# Patient Record
Sex: Female | Born: 1965 | Race: White | Hispanic: Yes | Smoking: Never smoker
Health system: Southern US, Community
[De-identification: ages and names within clinical notes are randomized; demographics above are authoritative.]

## PROBLEM LIST (undated history)

## (undated) DIAGNOSIS — J45909 Unspecified asthma, uncomplicated: Secondary | ICD-10-CM

## (undated) DIAGNOSIS — E8881 Metabolic syndrome: Secondary | ICD-10-CM

## (undated) DIAGNOSIS — E88819 Insulin resistance, unspecified: Secondary | ICD-10-CM

## (undated) DIAGNOSIS — M1990 Unspecified osteoarthritis, unspecified site: Secondary | ICD-10-CM

## (undated) DIAGNOSIS — G4730 Sleep apnea, unspecified: Secondary | ICD-10-CM

## (undated) DIAGNOSIS — E559 Vitamin D deficiency, unspecified: Secondary | ICD-10-CM

## (undated) DIAGNOSIS — G8929 Other chronic pain: Secondary | ICD-10-CM

## (undated) DIAGNOSIS — I10 Essential (primary) hypertension: Secondary | ICD-10-CM

## (undated) HISTORY — DX: Metabolic syndrome: E88.81

## (undated) HISTORY — DX: Unspecified asthma, uncomplicated: J45.909

## (undated) HISTORY — PX: CHOLECYSTECTOMY: SHX55

## (undated) HISTORY — DX: Insulin resistance, unspecified: E88.819

## (undated) HISTORY — DX: Sleep apnea, unspecified: G47.30

## (undated) HISTORY — DX: Essential (primary) hypertension: I10

## (undated) HISTORY — DX: Other chronic pain: G89.29

## (undated) HISTORY — DX: Unspecified osteoarthritis, unspecified site: M19.90

## (undated) HISTORY — DX: Vitamin D deficiency, unspecified: E55.9

## (undated) HISTORY — PX: GALLBLADDER SURGERY: SHX652

---

## 2014-03-04 ENCOUNTER — Encounter (HOSPITAL_COMMUNITY): Payer: Self-pay | Admitting: Emergency Medicine

## 2014-03-04 ENCOUNTER — Emergency Department (HOSPITAL_COMMUNITY)
Admission: EM | Admit: 2014-03-04 | Discharge: 2014-03-04 | Disposition: A | Discharge status: Home / Self Care | Payer: Self-pay | Attending: Emergency Medicine | Admitting: Emergency Medicine

## 2014-03-04 DIAGNOSIS — I10 Essential (primary) hypertension: Secondary | ICD-10-CM | POA: Insufficient documentation

## 2014-03-04 DIAGNOSIS — R209 Unspecified disturbances of skin sensation: Secondary | ICD-10-CM | POA: Insufficient documentation

## 2014-03-04 DIAGNOSIS — G5600 Carpal tunnel syndrome, unspecified upper limb: Secondary | ICD-10-CM | POA: Insufficient documentation

## 2014-03-04 DIAGNOSIS — R202 Paresthesia of skin: Secondary | ICD-10-CM

## 2014-03-04 LAB — COMPREHENSIVE METABOLIC PANEL
ALT: 40 U/L — AB (ref 0–35)
ANION GAP: 11 (ref 5–15)
AST: 32 U/L (ref 0–37)
Albumin: 4 g/dL (ref 3.5–5.2)
Alkaline Phosphatase: 72 U/L (ref 39–117)
BILIRUBIN TOTAL: 0.3 mg/dL (ref 0.3–1.2)
BUN: 11 mg/dL (ref 6–23)
CHLORIDE: 105 meq/L (ref 96–112)
CO2: 26 meq/L (ref 19–32)
Calcium: 9.3 mg/dL (ref 8.4–10.5)
Creatinine, Ser: 0.63 mg/dL (ref 0.50–1.10)
GFR calc Af Amer: >90 mL/min (ref >90)
GFR calc non Af Amer: >90 mL/min (ref >90)
GLUCOSE: 108 mg/dL — AB (ref 70–99)
POTASSIUM: 3.9 meq/L (ref 3.7–5.3)
Sodium: 142 mEq/L (ref 137–147)
Total Protein: 7.4 g/dL (ref 6.0–8.3)

## 2014-03-04 LAB — CBC WITH DIFFERENTIAL/PLATELET
Basophils Absolute: 0 10*3/uL (ref 0.0–0.1)
Basophils Relative: 0 % (ref 0–1)
Eosinophils Absolute: 0.5 10*3/uL (ref 0.0–0.7)
Eosinophils Relative: 7 % — ABNORMAL HIGH (ref 0–5)
HCT: 39.5 % (ref 36.0–46.0)
HEMOGLOBIN: 13.1 g/dL (ref 12.0–15.0)
LYMPHS PCT: 37 % (ref 12–46)
Lymphs Abs: 2.7 10*3/uL (ref 0.7–4.0)
MCH: 27.9 pg (ref 26.0–34.0)
MCHC: 33.2 g/dL (ref 30.0–36.0)
MCV: 84 fL (ref 78.0–100.0)
MONOS PCT: 6 % (ref 3–12)
Monocytes Absolute: 0.4 10*3/uL (ref 0.1–1.0)
NEUTROS ABS: 3.7 10*3/uL (ref 1.7–7.7)
NEUTROS PCT: 50 % (ref 43–77)
Platelets: 208 10*3/uL (ref 150–400)
RBC: 4.7 MIL/uL (ref 3.87–5.11)
RDW: 13.8 % (ref 11.5–15.5)
WBC: 7.2 10*3/uL (ref 4.0–10.5)

## 2014-03-04 MED ORDER — METOPROLOL TARTRATE 50 MG PO TABS: 100.0000 mg | ORAL_TABLET | Freq: Two times a day (BID) | ORAL | Status: DC

## 2014-03-04 MED ORDER — IBUPROFEN 600 MG PO TABS: 600.0000 mg | ORAL_TABLET | Freq: Four times a day (QID) | ORAL | Status: DC | PRN

## 2014-03-04 NOTE — ED Notes (Signed)
Pt reports she has been without insurance and access to healthcare as well as prescription medications.  Is concerned that her BP is high due to "feel it in my ears and my head."  When asked what her goal for today's visit she stated "check my blood pressure, that's all."

## 2014-03-04 NOTE — Discharge Instructions (Signed)
Hipertensin (Hypertension) La hipertensin, conocida comnmente como presin arterial alta, se produce cuando la sangre bombea en las arterias con mucha fuerza. Las arterias son los vasos sanguneos que transportan la sangre desde el corazn hacia todas las partes del cuerpo. Una lectura de la presin arterial consiste en un nmero ms alto sobre un nmero ms bajo, por ejemplo, 110/72. El nmero ms alto (presin sistlica) corresponde a la presin interna de las arterias cuando el corazn [place]. El nmero ms bajo (presin diastlica) corresponde a la presin interna de las arterias cuando el corazn se relaja. En condiciones ideales, la presin arterial debe ser inferior a 120/80. La hipertensin fuerza al corazn a trabajar ms para Herbalist. Las arterias pueden estrecharse o ponerse rgidas. La hipertensin conlleva el riesgo de enfermedad cardaca, ictus y otros problemas.  Solis de riesgo de hipertensin son controlables, pero otros no lo son.  [employer] factores de riesgo que usted no puede Chief Technology Officer, se incluyen:   Manufacturing systems engineer. El riesgo es mayor para las Retail banker.  La edad. Los riesgos aumentan con la edad.  El sexo. Antes de los 45aos, los hombres corren ms [employer]. Despus de los 65aos, las mujeres corren ms [employer]. Entre los factores de riesgo que usted puede Chief Technology Officer, se incluyen:  No hacer la cantidad suficiente de actividad fsica o ejercicio.  Tener sobrepeso.  Consumir mucha grasa, azcar, caloras o sal en la dieta.  Beber alcohol en exceso. SIGNOS Y SNTOMAS Por lo general, la hipertensin no causa signos o sntomas. La hipertensin demasiado alta (crisis hipertensiva) puede causar dolor de cabeza, ansiedad, falta de aire y hemorragia nasal. DIAGNSTICO  Para detectar si usted tiene hipertensin, el mdico le medir la presin arterial mientras est sentado, con el brazo  levantado a la altura del corazn. Debe medirla al [facility] veces en el mismo brazo. Determinadas condiciones pueden causar una diferencia de presin arterial entre el brazo izquierdo y Insurance underwriter. El hecho de tener una sola lectura de la presin arterial ms alta que lo normal no significa que Stage manager. En el caso de tener una lectura de la presin arterial con un valor alto, pdale al mdico que la verifique nuevamente. [employer] hipertensin arterial incluye hacer cambios en el estilo de vida y, posiblemente, tomar medicamentos. Un estilo de vida saludable puede ayudar a bajar la presin arterial alta. Quiz deba cambiar algunos hbitos. Los Levi Strauss en el estilo de vida pueden incluir:  Seguir la dieta DASH. Esta dieta tiene un alto contenido de frutas, verduras y Psychologist, prison and probation services. Incluye poca cantidad de sal, carnes rojas y azcares agregados.  Hacer al menos 2horas de actividad fsica enrgica todas las semanas.  Perder peso, si es necesario.  No fumar.  Limitar el consumo de bebidas alcohlicas.  Aprender formas de reducir el estrs. Si los cambios en el estilo de vida no son suficientes para Child psychotherapist la presin arterial, el mdico puede recetarle medicamentos. Quiz necesite tomar ms de uno. Trabaje en conjunto con su mdico para comprender los riesgos y los beneficios. INSTRUCCIONES PARA EL CUIDADO EN EL HOGAR  Haga que le midan de nuevo la presin arterial segn las indicaciones del [place] los medicamentos solamente como se lo haya indicado el mdico. Siga cuidadosamente las indicaciones. Los medicamentos para la presin arterial deben tomarse segn las indicaciones. Los medicamentos pierden eficacia al omitir las dosis. El hecho de omitir  las dosis tambin [address] de otros [place].  No fume.  Contrlese la presin arterial en su casa segn las indicaciones del mdico. SOLICITE ATENCIN MDICA SI:   Piensa  que tiene una reaccin alrgica a los medicamentos.  Tiene mareos o dolores de cabeza con Naval architect.  Tiene hinchazn en los tobillos.  Tiene problemas de visin. SOLICITE ATENCIN MDICA DE INMEDIATO SI:  Siente un dolor de cabeza intenso o confusin.  Siente debilidad inusual, adormecimiento o que Hospital doctor.  Siente dolor intenso en el pecho o en el abdomen.  Vomita repetidas veces.  Tiene dificultad para respirar. ASEGRESE DE QUE:   Comprende estas instrucciones.  Controlar su afeccin.  Recibir ayuda de inmediato si no mejora o si empeora. Document Released: 07/18/2005 Document Revised: 12/02/2013 [facility] Patient Information 2015 [place], [place]. This information is not intended to replace advice given to you by your health care provider. Make sure you discuss any questions you have with your health care provider. Liberacin del tnel carpiano (Carpal Tunnel Release) Hoy le han realizado la liberacin (reparacin) del tnel carpiano. Este procedimiento se realiz para Technical sales engineer presin [employer] nervios y tendones de la parte inferior de la [place].  INFORME AL PROFESIONAL QUE LO ASISTE SOBRE LOS SIGUIENTES PUNTOS:  Alergias.  Medicamentos que toma, incluyendo hierbas, gotas oftlmicas, medicamentos de venta libre y cremas.  Uso de esteroides (por va oral o cremas).  Problemas anteriores debido a anestsicos o a Patent examiner.  Antecedentes de problemas sanguneos o cogulos sanguneos (tromboflebitis).  Cirugas previas.  Otros problemas de [place].  Posibilidad de embarazo, si correspondiera RIESGOS Y COMPLICACIONES Algunos problemas que pueden ocurrir luego del procedimiento son:  Infeccin.  Pueden ocurrir [employer] nervios, las arterias o tendones. Esto es poco frecuente.  El sangrado. ANTES DEL PROCEDIMIENTO  Esta ciruga se practica bajo anestesia general, mientras se encuentra dormido, o puede llevarse a cabo bajo un bloqueo regional  en el que slo el antebrazo y el rea de la ciruga estn dormidos.  Si la ciruga se efecta bajo un bloqueo regional, el adormecimiento desaparecer gradualmente en algunas horas luego de la ciruga. INSTRUCCIONES PARA EL CUIDADO DOMICILIARIO  Haga que una persona responsable lo acompae durante 24 horas.  No conduzca un automvil, ni una bicicleta; no participe en actividades en las que pueda lastimarse, no tome transportes pblicos hasta que suspenda los analgsicos opiceos para Engineer, materials y Engineer, manufacturing el consentimiento del profesional que lo asiste.  Utilice los medicamentos de venta libre o de prescripcin para Chief Technology Officer, Environmental health practitioner o la [place], segn se lo indique el profesional que lo asiste.  Deber aplicar hielo en la palma de la mano de la [place] afectada.  Ponga el hielo en una bolsa plstica.  Colquese una toalla entre la piel y Copy.  Aplique el hielo durante 20 a 30 minutos, 4 veces por da.  Si le Patent attorney una tablilla para evitar que la [place] se doble, sela como le indicaron. Es importante que use la tabilla durante la noche, o segn le indicaron. Utilice la tablilla mientras sienta dolor o adormecimiento en la mano, el brazo o la [place]. Esto puede durar entre 1 y 2 meses.  Mantenga la mano elevada por encima del nivel del corazn (el centro del pecho) todo lo posible. Esto evitar la hinchazn y las [place].  Cambie el vendaje tal como se le indic.  Mantenga la herida limpia y seca. SOLICITE ATENCIN MDICA SI:  El dolor no  se alivia con los medicamentos.  Presenta dolor o adormecimiento en la mano.  Aparece una hemorragia en el lugar de la ciruga.  Desarrolla una temperatura superior a 38,9 C (102 F).  Presenta enrojecimiento o hinchazn en la zona de la operacin.  Desarrolla nuevos sntomas. SOLICITE ATENCIN MDICA DE INMEDIATO SI:  Aparece una erupcin cutnea.  Presenta dificultades respiratorias.  Siente dificultad  para respirar o parece tener algn tipo de reaccin o efectos secundarios a los medicamentos. Document Released: 07/18/2005 Document Revised: 10/10/2011 [facility] Patient Information 2015 [place], [place]. This information is not intended to replace advice given to you by your health care provider. Make sure you discuss any questions you have with your health care provider.   Emergency Department Resource Guide 1) Find a Doctor and Pay Out of Pocket Although you won't have to find out who is covered by your insurance plan, it is a good idea to ask around and get recommendations. You will then need to call the office and see if the doctor you have chosen will accept you as a new patient and what types of options they offer for patients who are self-pay. Some doctors offer discounts or will set up payment plans for their patients who do not have insurance, but you will need to ask so you aren't surprised when you get to your appointment.  2) Contact Your Local Health Department Not all health departments have doctors that can see patients for sick visits, but many do, so it is worth a call to see if yours does. If you don't know where your local health department is, you can check in your phone book. The CDC also has a tool to help you locate your state's health department, and many state websites also have listings of all of their local health departments.  3) Find a Walk-in Clinic If your illness is not likely to be very severe or complicated, you may want to try a walk in clinic. These are popping up all over the country in pharmacies, drugstores, and shopping centers. They're usually staffed by nurse practitioners or physician assistants that have been trained to treat common illnesses and complaints. They're usually fairly quick and inexpensive. However, if you have serious medical issues or chronic medical problems, these are probably not your best option.  No Primary Care Doctor: - Call Health  Connect at  [phone] - they can help you locate a primary care doctor that  accepts your insurance, provides certain services, etc. - Physician Referral Service- [phone]  Chronic Pain Problems: Organization         Address  Phone   Notes  [facility]  [phone] Patients need to be referred by their primary care doctor.   Medication Assistance: Organization         Address  Phone   Notes  [facility] [address] [place], [place] [phone] --Must be a resident of [place] -- Must have NO insurance coverage whatsoever (no Medicaid/ Medicare, etc.) -- The pt. MUST have a primary care doctor that directs their care regularly and follows them in the community   MedAssist  [phone]   [employer]  [phone]    Agencies that provide inexpensive medical care: Organization         Address  Phone   Notes  [facility]  [phone]   [facility]    [phone]  [facility] [address] [place], [place] [address] [phone]   [facility] [address] [place] [place] [phone]   Planned Parenthood    [phone]   [facility]    [phone]   Community Health and [facility]  [address], [place] Phone:  [phone], Fax:  [phone] Hours of Operation:  9 am - 6 pm, M-F.  Also accepts Medicaid/Medicare and self-pay.  [facility]  [address], [place] Phone: [phone], Fax: [phone]. Hours of Operation:  8:30 am - 5:30 pm, M-F.  Also accepts Medicaid and self-pay.  [facility] [place] [address] [place] Phone: [phone]   [facility] [address] Natasha Bence [place], [place] [phone], Ext. [phone] Mondays & Thursdays: 7-9 AM.  First 15 patients are seen on a first come, first serve basis.     Medicaid-accepting [facility] Providers:  Organization         Address  Phone   Notes  [facility] [address], [place] [phone] Also accepts self-pay patients.  [facility] [address] Laurell Josephs [place], [place]  [phone]   [facility] [address], [place] [phone]   [facility] [address] [place] [phone]   Renaye Rakers [address], [place]   [phone] Only accepts [place] Access [place] patients after they have their name applied to their card.   Self-Pay (no insurance) in [facility]:  Organization         Address  Phone   Notes  Sickle Cell Patients, [facility] [address] [place], [place] [phone]   [facility] Urgent Care [address] [place], [place] [phone]   [facility]  [address], [place] [phone]   [facility]/Dr. Osei-Bonsu  [address] [place] or [address], [place] [phone] Phone number for both [place] and [place] locations is the same.  Urgent Medical and [facility] [address] [place] [phone]   [facility] [address], [place] or [address] [phone]   [facility] [address], [place] [phone], phone; [phone], fax Sees patients 1st and 3rd Saturday of every month.  Must not qualify for public or private insurance (i.e. Medicaid, Medicare, [place] Health Choice, Veterans' Benefits)  Household income should be no more than 200% of the poverty level The clinic cannot treat you if you are pregnant or think you are pregnant  Sexually transmitted diseases are not treated at the clinic.    Dental Care: Organization         Address  Phone  Notes  [facility]  Department of [facility] [address] [place], [place] [phone] Accepts children up to age 14 who are enrolled in [place] or [place] Health Choice; pregnant women with a Medicaid card; and children who have applied for Medicaid or [place] Health Choice, but were declined, whose parents can pay a reduced fee at time of service.  [facility] of [facility]  [address], [place] [phone] Accepts children up to age 37 who  are enrolled in Medicaid or [place] Health Choice; pregnant women with a Medicaid card; and children who have applied for Medicaid or [place] Health Choice, but were declined, whose parents can pay a reduced fee at time of service.  [facility]  [address] [place], [place] [phone] Patients are seen by appointment only. Walk-ins are not accepted. [facility] will see patients 41 years of age and older. Monday - Tuesday (8am-5pm) Most Wednesdays (8:30-5pm) $30 per visit, cash only  [facility] Adult Dental Access PROGRAM  [address], [facility] [phone] Patients are seen by appointment only. Walk-ins are not accepted. [facility] will see patients 52 years of age and older. One Wednesday Evening (Monthly: Volunteer Based).  $30 per visit, cash only  [employer]  [phone] for adults; Children under age 91, call Graduate Pediatric Dentistry at [phone]. Children aged 44-14, please call [phone] to request a pediatric application.  Dental services are provided in all areas of dental care including fillings, crowns and bridges, complete and partial dentures, implants, gum treatment, root canals, and extractions. Preventive care is also provided. Treatment is provided to both adults and children. Patients are selected via a lottery and there is often a waiting list.   [facility] [address], [place]  [phone]  [email or web address]   [facility] [address] [place], [place] [phone], Ext. [phone] Second and Fourth Thursday of each month, opens at 6:30 AM; Clinic ends at 9 AM.  Patients are seen on a first-come first-served basis, and a limited number are seen during each clinic.   [facility]  [address] Ether Griffins [place], [place] [phone]   Eligibility Requirements You must have lived in [place], [place], or [place] counties for at least the last three months.   You cannot be eligible for state or federal sponsored [place], including [employer], [place], or [employer].   You generally cannot be eligible for healthcare insurance through your employer.    How to apply: Eligibility screenings are held every Tuesday and Wednesday afternoon from 1:00 pm until 4:00 pm. You do not need an appointment for the interview!  [facility] [address] [place], [place] [phone]   [facility] Health Department  [phone]   [facility] Health Department  [phone]   [facility] Health Department  [phone]    Behavioral Health Resources in the Community: Intensive Outpatient Programs Organization         Address  Phone  Notes  [facility] [address] [place], [place] [phone]   [facility] Outpatient [address] [place], [place] [phone]   [facility]: Alcohol & Drug Svcs [address], [place], [place]  [phone]   [facility] Mental Health [address]  [place], [place] [phone] or [phone]   Substance Abuse Resources Organization         Address  Phone  Notes  Alcohol [facility] Drug Services  [phone]   [facility]  [phone]   [facility] [place]  [phone]   Floydene Flock  [phone]   Residential & Outpatient Substance Abuse Program  [phone]   Psychological Services Organization          Address  Phone  Notes  [facility] Behavioral Health  [phone]   [facility] Services  [phone]   [facility] [address],  [place] [phone] or [phone]    Mobile Crisis Teams Organization         Address  Phone  Notes  [facility], Mobile Crisis Care Unit  [phone]   [facility]  [address] [place], [place] [phone]   Doristine Locks [address] [place] [phone]    Self-Help/Support Groups Organization         Address  Phone             Notes  [facility] [place] - variety of support groups  [phone]- I7437963 Call for more information  Narcotics Anonymous (NA), [facility] [address], [employer] [place]  2 meetings at this location   Statistician         Address  Phone  Notes  [facility] [address],    [place]  [phone]   [facility]  [address] [place] [address], [place], [place] [phone]   [facility] [address] [place], [place] [phone] Admissions: 8am-3pm M-F  [facility] [address],    [place], [place] [phone]   [facility] [address] [place], [place], [place] [phone]   [facility] [address],  [place], [place] [phone]   [facility] - Intensive Outpatient [address] Laurell Josephs [address], [place], [place] [phone]   [facility] ([facility] [address]  [place], [place] [phone] or [phone]   [facility] ([facility]) [address], [place], [place] [phone] Accepts Medicaid  [facility] [address]  [place] [phone] Substance Abuse/Addiction Treatment   [facility] Organization         Address  Phone  Notes  [facility]  [phone]   Angie Fava, PhD [address] Ervin Knack [place], [place]   [phone] or [phone]   [facility] Behavioral   [address] [place], [place] [phone]   [facility] [address] [place], [place] [phone] Insurance/Medicaid/sponsorship through [facility] and Families [address]                                    [place], [place] [phone] Therapy/tele-psych/case  [facility] [address], [place] [phone]    Dr. Lolly Mustache  [phone]   [facility]  United Way [facility] Dept. 1) [address], [place] 2) [address], [place] 3)  [address], [place] [phone]  [phone]   [facility] Child Abuse Hotline 6472325438 or [phone] (After Hours)

## 2014-03-04 NOTE — ED Provider Notes (Signed)
CSN: [id number]     Arrival date & time 03/04/14  0033 History   First MD Initiated Contact with Patient 03/04/14 0304     Chief Complaint  Patient presents with  . Hypertension     (Consider location/radiation/quality/duration/timing/severity/associated sxs/prior Treatment) HPI The patient has a history of hypertension but has not been on her blood pressure medication for nearly a year. She states that she lost her insurance and was unable to follow with her primary Dr. Over the past few days she's been taking her blood pressure has been persistently elevated in the 150s/100s. This is associated with a frontal throbbing headache and pounding in ears. She had no focal weakness or numbness. Currently her headache is improved. Her blood pressure is 140s/90s.  Patient also complains of episodic bilateral hand paresthesias. She states it's worse at night. She currently has no symptoms. Denies any weakness associated with this. History reviewed. No pertinent past medical history. History reviewed. No pertinent past surgical history. No family history on file. History  Substance Use Topics  . Smoking status: Never Smoker   . Smokeless tobacco: Not on file  . Alcohol Use: No   OB History   Grav Para Term Preterm Abortions TAB SAB Ect Mult Living                 Review of Systems  Constitutional: Negative for fever and chills.  Eyes: Negative for photophobia and visual disturbance.  Respiratory: Negative for cough, chest tightness and shortness of breath.   Cardiovascular: Negative for chest pain, palpitations and leg swelling.  Gastrointestinal: Negative for nausea, vomiting, abdominal pain, diarrhea and constipation.  Genitourinary: Negative for dysuria, frequency and flank pain.  Musculoskeletal: Negative for back pain, myalgias, neck pain and neck stiffness.  Skin: Negative for rash and wound.  Neurological: Positive for headaches. Negative for dizziness, tremors, syncope, weakness,  light-headedness and numbness.  All other systems reviewed and are negative.     Allergies  Review of patient's allergies indicates no known allergies.  Home Medications   Prior to Admission medications   Not on File   BP 143/82  Pulse 83  Temp(Src) 98.1 F (36.7 C) (Oral)  Resp 16  Ht 5\' 4"  (1.626 m)  Wt 222 lb (100.699 kg)  BMI 38.09 kg/m2  SpO2 97% Physical Exam  Nursing note and vitals reviewed. Constitutional: She is oriented to person, place, and time. She appears well-developed and well-nourished. No distress.  HENT:  Head: Normocephalic and atraumatic.  Mouth/Throat: Oropharynx is clear and moist.  No sinus tenderness to percussion.  Eyes: EOM are normal. Pupils are equal, round, and reactive to light.  Neck: Normal range of motion. Neck supple.  No meningismus  Cardiovascular: Normal rate and regular rhythm.   Pulmonary/Chest: Effort normal and breath sounds normal. No respiratory distress. She has no wheezes. She has no rales. She exhibits no tenderness.  Abdominal: Soft. Bowel sounds are normal. She exhibits no distension and no mass. There is no tenderness. There is no rebound and no guarding.  Musculoskeletal: Normal range of motion. She exhibits no edema and no tenderness.  No calf swelling or tenderness. Negative Tinel's and Phalen's sign. Good distal cap refill.  Neurological: She is alert and oriented to person, place, and time.  5/5 motor in all extremities. Sensation is intact. Finger to nose intact. Patient ambulatory without difficulty.  Skin: Skin is warm and dry. No rash noted. No erythema.  Psychiatric: She has a normal mood and affect. Her behavior  is normal.    ED Course  Procedures (including critical care time) Labs Review Labs Reviewed  CBC WITH DIFFERENTIAL - Abnormal; Notable for the following:    Eosinophils Relative 7 (*)    All other components within normal limits  COMPREHENSIVE METABOLIC PANEL - Abnormal; Notable for the  following:    Glucose, Bld 108 (*)    ALT 40 (*)    All other components within normal limits    Imaging Review No results found.   EKG Interpretation None      MDM   Final diagnoses:  None    We'll refill patient's blood pressure medication and give resource list to establish primary care Dr. Bernestine Amass the patient's paresthesias are due to carpal tunnel syndrome. We'll place in bilateral wrist splints to start on anti-inflammatories. She will also be referred to a hand surgeon. She's been given return precautions and is voiced understanding. She is in normal neurologic exam this time. I do not think imaging is necessary.    Loren Racer, MD 03/04/14 [phone]

## 2014-03-04 NOTE — ED Notes (Signed)
Pt. reports elevated blood pressure ( 157/96 ) for the past several days with headache / head pressure /nausea.

## 2014-12-12 ENCOUNTER — Emergency Department (HOSPITAL_COMMUNITY)
Admission: EM | Admit: 2014-12-12 | Discharge: 2014-12-12 | Disposition: A | Discharge status: Home / Self Care | Payer: BLUE CROSS/BLUE SHIELD | Attending: Emergency Medicine | Admitting: Emergency Medicine

## 2014-12-12 ENCOUNTER — Emergency Department (HOSPITAL_COMMUNITY): Payer: BLUE CROSS/BLUE SHIELD

## 2014-12-12 ENCOUNTER — Encounter (HOSPITAL_COMMUNITY): Payer: Self-pay | Admitting: *Deleted

## 2014-12-12 DIAGNOSIS — M25562 Pain in left knee: Secondary | ICD-10-CM | POA: Diagnosis not present

## 2014-12-12 DIAGNOSIS — R51 Headache: Secondary | ICD-10-CM | POA: Diagnosis not present

## 2014-12-12 DIAGNOSIS — Z79899 Other long term (current) drug therapy: Secondary | ICD-10-CM | POA: Insufficient documentation

## 2014-12-12 LAB — CBG MONITORING, ED: Glucose-Capillary: 97 mg/dL (ref 65–99)

## 2014-12-12 MED ORDER — MELOXICAM 7.5 MG PO TABS: 7.5000 mg | ORAL_TABLET | Freq: Every day | ORAL | Status: DC

## 2014-12-12 NOTE — Discharge Instructions (Signed)
Read the information below.  Use the prescribed medication as directed.  Please discuss all new medications with your pharmacist.  You may return to the Emergency Department at any time for worsening condition or any new symptoms that concern you.   If you develop uncontrolled pain, weakness or numbness of the extremity, severe discoloration of the skin, or you are unable to walk or move your knee, return to the ER for a recheck.      Knee Pain The knee is the complex joint between your thigh and your lower leg. It is made up of bones, tendons, ligaments, and cartilage. The bones that make up the knee are:  The femur in the thigh.  The tibia and fibula in the lower leg.  The patella or kneecap riding in the groove on the lower femur. CAUSES  Knee pain is a common complaint with many causes. A few of these causes are:  Injury, such as:  A ruptured ligament or tendon injury.  Torn cartilage.  Medical conditions, such as:  Gout  Arthritis  Infections  Overuse, over training, or overdoing a physical activity. Knee pain can be minor or severe. Knee pain can accompany debilitating injury. Minor knee problems often respond well to self-care measures or get well on their own. More serious injuries may need medical intervention or even surgery. SYMPTOMS The knee is complex. Symptoms of knee problems can vary widely. Some of the problems are:  Pain with movement and weight bearing.  Swelling and tenderness.  Buckling of the knee.  Inability to straighten or extend your knee.  Your knee locks and you cannot straighten it.  Warmth and redness with pain and fever.  Deformity or dislocation of the kneecap. DIAGNOSIS  Determining what is wrong may be very straight forward such as when there is an injury. It can also be challenging because of the complexity of the knee. Tests to make a diagnosis may include:  Your caregiver taking a history and doing a physical exam.  Routine  X-rays can be used to rule out other problems. X-rays will not reveal a cartilage tear. Some injuries of the knee can be diagnosed by:  Arthroscopy a surgical technique by which a small video camera is inserted through tiny incisions on the sides of the knee. This procedure is used to examine and repair internal knee joint problems. Tiny instruments can be used during arthroscopy to repair the torn knee cartilage (meniscus).  Arthrography is a radiology technique. A contrast liquid is directly injected into the knee joint. Internal structures of the knee joint then become visible on X-ray film.  An MRI scan is a non X-ray radiology procedure in which magnetic fields and a computer produce two- or three-dimensional images of the inside of the knee. Cartilage tears are often visible using an MRI scanner. MRI scans have largely replaced arthrography in diagnosing cartilage tears of the knee.  Blood work.  Examination of the fluid that helps to lubricate the knee joint (synovial fluid). This is done by taking a sample out using a needle and a syringe. TREATMENT The treatment of knee problems depends on the cause. Some of these treatments are:  Depending on the injury, proper casting, splinting, surgery, or physical therapy care will be needed.  Give yourself adequate recovery time. Do not overuse your joints. If you begin to get sore during workout routines, back off. Slow down or do fewer repetitions.  For repetitive activities such as cycling or running, maintain your strength  and nutrition.  Alternate muscle groups. For example, if you are a weight lifter, work the upper body on one day and the lower body the next.  Either tight or weak muscles do not give the proper support for your knee. Tight or weak muscles do not absorb the stress placed on the knee joint. Keep the muscles surrounding the knee strong.  Take care of mechanical problems.  If you have flat feet, orthotics or special shoes  may help. See your caregiver if you need help.  Arch supports, sometimes with wedges on the inner or outer aspect of the heel, can help. These can shift pressure away from the side of the knee most bothered by osteoarthritis.  A brace called an "unloader" brace also may be used to help ease the pressure on the most arthritic side of the knee.  If your caregiver has prescribed crutches, braces, wraps or ice, use as directed. The acronym for this is PRICE. This means protection, rest, ice, compression, and elevation.  Nonsteroidal anti-inflammatory drugs (NSAIDs), can help relieve pain. But if taken immediately after an injury, they may actually increase swelling. Take NSAIDs with food in your stomach. Stop them if you develop stomach problems. Do not take these if you have a history of ulcers, stomach pain, or bleeding from the bowel. Do not take without your caregiver's approval if you have problems with fluid retention, heart failure, or kidney problems.  For ongoing knee problems, physical therapy may be helpful.  Glucosamine and chondroitin are over-the-counter dietary supplements. Both may help relieve the pain of osteoarthritis in the knee. These medicines are different from the usual anti-inflammatory drugs. Glucosamine may decrease the rate of cartilage destruction.  Injections of a corticosteroid drug into your knee joint may help reduce the symptoms of an arthritis flare-up. They may provide pain relief that lasts a few months. You may have to wait a few months between injections. The injections do have a small increased risk of infection, water retention, and elevated blood sugar levels.  Hyaluronic acid injected into damaged joints may ease pain and provide lubrication. These injections may work by reducing inflammation. A series of shots may give relief for as long as 6 months.  Topical painkillers. Applying certain ointments to your skin may help relieve the pain and stiffness of  osteoarthritis. Ask your pharmacist for suggestions. Many over the-counter products are approved for temporary relief of arthritis pain.  In some countries, doctors often prescribe topical NSAIDs for relief of chronic conditions such as arthritis and tendinitis. A review of treatment with NSAID creams found that they worked as well as oral medications but without the serious side effects. PREVENTION  Maintain a healthy weight. Extra pounds put more strain on your joints.  Get strong, stay limber. Weak muscles are a common cause of knee injuries. Stretching is important. Include flexibility exercises in your workouts.  Be smart about exercise. If you have osteoarthritis, chronic knee pain or recurring injuries, you may need to change the way you exercise. This does not mean you have to stop being active. If your knees ache after jogging or playing basketball, consider switching to swimming, water aerobics, or other low-impact activities, at least for a few days a week. Sometimes limiting high-impact activities will provide relief.  Make sure your shoes fit well. Choose footwear that is right for your sport.  Protect your knees. Use the proper gear for knee-sensitive activities. Use kneepads when playing volleyball or laying carpet. Buckle your seat belt  every time you drive. Most shattered kneecaps occur in car accidents.  Rest when you are tired. SEEK MEDICAL CARE IF:  You have knee pain that is continual and does not seem to be getting better.  SEEK IMMEDIATE MEDICAL CARE IF:  Your knee joint feels hot to the touch and you have a high fever. MAKE SURE YOU:   Understand these instructions.  Will watch your condition.  Will get help right away if you are not doing well or get worse. Document Released: 05/15/2007 Document Revised: 10/10/2011 Document Reviewed: 05/15/2007 [facility] Patient Information 2015 [place], [place]. This information is not intended to replace advice given to you by  your health care provider. Make sure you discuss any questions you have with your health care provider.

## 2014-12-12 NOTE — ED Notes (Signed)
Patient refused crutches.

## 2014-12-12 NOTE — ED Notes (Signed)
Pt reports left knee pain x 3 days. Denies injury. Ambulatory at triage.

## 2014-12-12 NOTE — ED Provider Notes (Signed)
CSN: [id number]     Arrival date & time 12/12/14  1848 History  This chart was scribed for Trixie DredgeEmily Sherrika Weakland, PA-C, working with Cathren LaineKevin Steinl, MD by Chestine SporeSoijett Blue, ED Scribe. The patient was seen in room TR09C/TR09C at 7:20 PM.    Chief Complaint  Patient presents with  . Knee Pain     The history is provided by the patient. No language interpreter was used.    Fuller PlanMaria Bristow is a 49 y.o. female who presents to the Emergency Department complaining of left knee pain onset 2 days. Pt denies any recent injury or fall. When the pt was 49 years old she injured her knee skating and she has not had problems since then. Pt is now feeling a pain when she dorsiflexes and plantar flexes her foot. Pt thinks that she has fluid in the knee as well as the meniscus giving her trouble. She notes that she has tried naprosyn with no relief of her symptoms. She denies numbness, tingling, left calf pain, leg swelling, any other joint pain, fevers, chills, myalgias. Denies being sick recently or having an infection lately.    History reviewed. No pertinent past medical history. History reviewed. No pertinent past surgical history. History reviewed. No pertinent family history. History  Substance Use Topics  . Smoking status: Never Smoker   . Smokeless tobacco: Not on file  . Alcohol Use: No   OB History    No data available     Review of Systems  Constitutional: Negative for fever and chills.  HENT: Negative for congestion.   Cardiovascular: Negative for leg swelling.  Musculoskeletal: Positive for myalgias and arthralgias. Negative for joint swelling.  Skin: Negative for wound.  Allergic/Immunologic: Negative for immunocompromised state.  Neurological: Positive for headaches (mild frontal headache). Negative for weakness and numbness.  Hematological: Does not bruise/bleed easily.  Psychiatric/Behavioral: Negative for self-injury.      Allergies  Review of patient's allergies indicates no known  allergies.  Home Medications   Prior to Admission medications   Medication Sig Start Date End Date Taking? Authorizing Provider  ibuprofen (ADVIL,MOTRIN) 600 MG tablet Take 1 tablet (600 mg total) by mouth every 6 (six) hours as needed. 03/04/14   Loren Raceravid Yelverton, MD  metoprolol (LOPRESSOR) 50 MG tablet Take 2 tablets (100 mg total) by mouth 2 (two) times daily. 03/04/14   Loren Raceravid Yelverton, MD   BP 129/79 mmHg  Pulse 60  Temp(Src) 97.9 F (36.6 C) (Oral)  Resp 18  SpO2 98% Physical Exam  Constitutional: She appears well-developed and well-nourished. No distress.  HENT:  Head: Normocephalic and atraumatic.  Neck: Neck supple.  Cardiovascular: Intact distal pulses.   Pulmonary/Chest: Effort normal.  Musculoskeletal:       Left knee: She exhibits no swelling, no erythema, no LCL laxity and no MCL laxity. Tenderness (mild) found.       Left lower leg: Normal.  LEFT Lower Extremities:  Calf without tenderness. Sensation intact. Left knee: No laxity of the joint. No erythema, edema, or warmth. Mild diffuse tenderness to the anterior aspect of the knee. Able to flex left knee to 90 degrees with full extension. No discoloration. Distal pulses intact.     Neurological: She is alert. No sensory deficit.  Skin: She is not diaphoretic.  Nursing note and vitals reviewed.   ED Course  Procedures (including critical care time) DIAGNOSTIC STUDIES: Oxygen Saturation is 98% on RA, nl by my interpretation.    COORDINATION OF CARE: 7:24 PM-Discussed treatment plan which  includes Left knee X-ray with pt at bedside and pt agreed to plan.   Labs Review Labs Reviewed  CBG MONITORING, ED    Imaging Review Dg Knee Complete 4 Views Left  12/12/2014   CLINICAL DATA:  Acute onset of left knee pain for 3 days. Initial encounter.  EXAM: LEFT KNEE - COMPLETE 4+ VIEW  COMPARISON:  None.  FINDINGS: There is no evidence of fracture or dislocation. An osseous fragment overlying the lateral collateral ligament  complex may reflect remote traumatic injury. Small tibial spine and wall osteophytes are noted. There is mild cortical irregularity along the articular surface of the patella. The joint spaces are preserved. No significant degenerative change is seen; the patellofemoral joint is grossly unremarkable in appearance.  No significant joint effusion is seen. There is suggestion of medial soft tissue swelling at the level of the knee.  IMPRESSION: 1. No evidence of acute fracture or dislocation. 2. Suggestion of medial soft tissue swelling. 3. Osseous fragment overlying the lateral collateral ligament complex may reflect remote traumatic injury. 4. Minimal tibial spine and wall osteophytes noted. Mild cortical irregularity along the articular surface of the patella. Otherwise no significant degenerative change seen.   Electronically Signed   By: Roanna RaiderJeffery  Chang M.D.   On: 12/12/2014 20:01     EKG Interpretation None      MDM   Final diagnoses:  Left knee pain    Afebrile, nontoxic patient with left knee pain without injury x 2 days. Neurovascularly intact.  Xray unremarkable.   D/C home with knee sleeve, mobic, crutches (pt refused), orthopedic follow up.  Discussed result, findings, treatment, and follow up  with patient.  Pt given return precautions.  Pt verbalizes understanding and agrees with plan.        I personally performed the services described in this documentation, which was scribed in my presence. The recorded information has been reviewed and is accurate.    Trixie Dredgemily Azhar Yogi, PA-C 12/12/14 2114  Cathren LaineKevin Steinl, MD 12/12/14 2122

## 2015-02-20 ENCOUNTER — Encounter (HOSPITAL_COMMUNITY): Payer: Self-pay | Admitting: Emergency Medicine

## 2015-02-20 DIAGNOSIS — Z79899 Other long term (current) drug therapy: Secondary | ICD-10-CM | POA: Diagnosis not present

## 2015-02-20 DIAGNOSIS — Z791 Long term (current) use of non-steroidal anti-inflammatories (NSAID): Secondary | ICD-10-CM | POA: Diagnosis not present

## 2015-02-20 DIAGNOSIS — Y999 Unspecified external cause status: Secondary | ICD-10-CM | POA: Diagnosis not present

## 2015-02-20 DIAGNOSIS — S39012A Strain of muscle, fascia and tendon of lower back, initial encounter: Secondary | ICD-10-CM | POA: Diagnosis not present

## 2015-02-20 DIAGNOSIS — Y929 Unspecified place or not applicable: Secondary | ICD-10-CM | POA: Diagnosis not present

## 2015-02-20 DIAGNOSIS — Y939 Activity, unspecified: Secondary | ICD-10-CM | POA: Insufficient documentation

## 2015-02-20 DIAGNOSIS — X58XXXA Exposure to other specified factors, initial encounter: Secondary | ICD-10-CM | POA: Diagnosis not present

## 2015-02-20 DIAGNOSIS — M545 Low back pain: Secondary | ICD-10-CM | POA: Diagnosis present

## 2015-02-20 NOTE — ED Notes (Addendum)
Patient with two week history of back pain.  Patient denies any injury to the area but she states she has been standing more and more at work.  Patient has been taking diclofenac and ibuprofen, has helped but only for an hour or two.  Patient states that there is so much pain.  Patient states that she does not have any urinary symptoms.

## 2015-02-21 ENCOUNTER — Emergency Department (HOSPITAL_COMMUNITY)
Admission: EM | Admit: 2015-02-21 | Discharge: 2015-02-21 | Disposition: A | Discharge status: Home / Self Care | Payer: BLUE CROSS/BLUE SHIELD | Attending: Emergency Medicine | Admitting: Emergency Medicine

## 2015-02-21 DIAGNOSIS — S39012A Strain of muscle, fascia and tendon of lower back, initial encounter: Secondary | ICD-10-CM

## 2015-02-21 MED ORDER — KETOROLAC TROMETHAMINE 60 MG/2ML IM SOLN
60.0000 mg | Freq: Once | INTRAMUSCULAR | Status: AC
  Administered 2015-02-21: 60 mg via INTRAMUSCULAR
  Filled 2015-02-21: qty 2

## 2015-02-21 MED ORDER — CYCLOBENZAPRINE HCL 10 MG PO TABS
10.0000 mg | ORAL_TABLET | Freq: Once | ORAL | Status: AC
  Administered 2015-02-21: 10 mg via ORAL
  Filled 2015-02-21: qty 1

## 2015-02-21 MED ORDER — MELOXICAM 15 MG PO TABS: 15.0000 mg | ORAL_TABLET | Freq: Every day | ORAL | Status: DC

## 2015-02-21 MED ORDER — METHOCARBAMOL 500 MG PO TABS: 500.0000 mg | ORAL_TABLET | Freq: Two times a day (BID) | ORAL | Status: DC

## 2015-02-21 NOTE — Discharge Instructions (Signed)
Take Mobic as needed for pain. Take Robaxin as needed for muscle spasm. You may take these medications together. Refer to attached documents for more information.  °

## 2015-02-21 NOTE — ED Provider Notes (Signed)
CSN: [id number]     Arrival date & time 02/20/15  2320 History   First MD Initiated Contact with Patient 02/21/15 0214     Chief Complaint  Patient presents with  . Back Pain     (Consider location/radiation/quality/duration/timing/severity/associated sxs/prior Treatment) HPI Comments: Patricia Gross, 49 y/o female presents with back pain. The pain started two weeks ago and is worsening. The back pain is greater on the right side. It is her lower back around the sacroiliac area. It does not radiate or shoot down her leg. 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 She has tried naprosyn with relief lasting for two hours. She denies loss of bowel or bladder control, trauma, headaches, fevers, or leg pain.  Patient is a 49 y.o. female presenting with back pain. The history is provided by the patient.  Back Pain Location:  Sacro-iliac joint Radiates to:  Does not radiate Onset quality:  Gradual Duration:  2 weeks Progression:  Worsening Chronicity:  New Relieved by:  Ibuprofen Worsened by:  Bending Associated symptoms: no bladder incontinence, no bowel incontinence, no fever, no headaches and no leg pain   Risk factors: no hx of cancer     History reviewed. No pertinent past medical history. History reviewed. No pertinent past surgical history. No family history on file. History  Substance Use Topics  . Smoking status: Never Smoker   . Smokeless tobacco: Not on file  . Alcohol Use: No   OB History    No data available     Review of Systems  Constitutional: Negative for fever.  Gastrointestinal: Negative for bowel incontinence.  Genitourinary: Negative for bladder incontinence.  Musculoskeletal: Positive for back pain.  Neurological: Negative for headaches.  All other systems reviewed and are negative.     Allergies  Review of patient's allergies indicates no known allergies.  Home Medications   Prior to Admission  medications   Medication Sig Start Date End Date Taking? Authorizing Provider  ibuprofen (ADVIL,MOTRIN) 600 MG tablet Take 1 tablet (600 mg total) by mouth every 6 (six) hours as needed. 03/04/14   Loren Racer, MD  meloxicam (MOBIC) 7.5 MG tablet Take 1 tablet (7.5 mg total) by mouth daily. 12/12/14   Trixie Dredge, PA-C  metoprolol (LOPRESSOR) 50 MG tablet Take 2 tablets (100 mg total) by mouth 2 (two) times daily. 03/04/14   Loren Racer, MD   BP 138/75 mmHg  Pulse 70  Temp(Src) 98.4 F (36.9 C) (Oral)  Resp 19  Ht  (1.626 m)  Wt 220 lb (99.791 kg)  BMI 37.74 kg/m2  SpO2 98% Physical Exam  Constitutional: She is oriented to person, place, and time. She appears well-developed and well-nourished. No distress.  HENT:  Head: Normocephalic and atraumatic.  Eyes: Conjunctivae and EOM are normal.  Neck: Normal range of motion. Neck supple.  Cardiovascular: Normal rate and regular rhythm.  Exam reveals no gallop and no friction rub.   No murmur heard. Pulmonary/Chest: Effort normal and breath sounds normal. She has no wheezes. She has no rales. She exhibits no tenderness.  Abdominal: Soft. She exhibits no distension. There is no tenderness. There is no rebound.  Musculoskeletal: Normal range of motion.       Arms: No midline spine tenderness to palpation.   Neurological: She is alert and oriented to person, place, and time. Coordination normal.  Speech is goal-oriented. Moves limbs without ataxia.   Skin: Skin is warm and dry.  Psychiatric: She  has a normal mood and affect. Her behavior is normal.  Nursing note and vitals reviewed.   ED Course  Procedures (including critical care time) Labs Review Labs Reviewed - No data to display  Imaging Review No results found.   EKG Interpretation None      MDM   Final diagnoses:  Strain of lumbar paraspinal muscle, initial encounter    6:23 AM Patient's back pain likely due to muscle strain. No bladder/bowel incontinence or  saddle paresthesias. Vitals stable and patient afebrile. Patient will have toradol and flexeril here.   [address] [place], PA-C 02/21/15 1610  Loren Racer, MD 02/21/15 [phone]

## 2015-04-27 ENCOUNTER — Encounter: Payer: Self-pay | Admitting: Medical

## 2015-11-13 DIAGNOSIS — M79673 Pain in unspecified foot: Secondary | ICD-10-CM | POA: Insufficient documentation

## 2015-11-13 DIAGNOSIS — I10 Essential (primary) hypertension: Secondary | ICD-10-CM | POA: Insufficient documentation

## 2015-11-13 DIAGNOSIS — I8390 Asymptomatic varicose veins of unspecified lower extremity: Secondary | ICD-10-CM | POA: Insufficient documentation

## 2015-11-13 DIAGNOSIS — J302 Other seasonal allergic rhinitis: Secondary | ICD-10-CM | POA: Insufficient documentation

## 2015-11-18 DIAGNOSIS — J45909 Unspecified asthma, uncomplicated: Secondary | ICD-10-CM | POA: Insufficient documentation

## 2016-01-25 DIAGNOSIS — M2550 Pain in unspecified joint: Secondary | ICD-10-CM | POA: Insufficient documentation

## 2016-01-25 HISTORY — DX: Pain in unspecified joint: M25.50

## 2016-07-15 ENCOUNTER — Ambulatory Visit (INDEPENDENT_AMBULATORY_CARE_PROVIDER_SITE_OTHER): Payer: BLUE CROSS/BLUE SHIELD | Admitting: Physician Assistant

## 2016-07-15 VITALS — BP 120/74 | HR 97 | Temp 98.5°F | Resp 18 | Ht 65.0 in | Wt 210.2 lb

## 2016-07-15 DIAGNOSIS — A084 Viral intestinal infection, unspecified: Secondary | ICD-10-CM | POA: Diagnosis not present

## 2016-07-15 MED ORDER — ONDANSETRON 4 MG PO TBDP: 4.0000 mg | ORAL_TABLET | Freq: Three times a day (TID) | ORAL | 0 refills | Status: DC | PRN

## 2016-07-15 NOTE — Progress Notes (Signed)
Patient ID: Patricia Gross, female     DOB: 1966/02/03, 50 y.o.    MRN: [id number]  PCP: No PCP Per Patient  Chief Complaint  Patient presents with  . Abdominal Pain    x yesterday  . Diarrhea    vomiting  . Back Pain    Subjective:   This patient is new to this practice and presents for evaluation of abdominal pain, nausea/vomiting and diarrhea. She also complains of back pain, arm pain, red places on her legs. She is accompanied by her daughter, also being seen for abdominal pain, nausea and vomiting, without diarrhea.  She and her daughter were in [place] yesterday renewing their passports. They ate meat at a restaurant that they think wasn't cooked properly.  "I'm miserable." Abdominal pain, nausea and vomiting began last night after work. Diarrhea started later. 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  Chills, no fever. Feels weak and mild headache. Able to drink liquids No urinary symptoms. No blood or mucous in the stool.  OTC Pepto-Bismol has helped some today.  Months of bilateral shoulder pain, RIGHT elbow pain that is associated with the RIGHT hand falling asleep, and stabbing low back pain. The back pain has been worse with the onset of the new GI symptoms.   Review of Systems As above  Prior to Admission medications   Medication Sig Start Date End Date Taking? Authorizing Provider  metoprolol (LOPRESSOR) 50 MG tablet Take 2 tablets (100 mg total) by mouth 2 (two) times daily. 03/04/14  Yes Loren Raceravid Yelverton, MD     No Known Allergies   Patient Active Problem List   Diagnosis Date Noted  . Fatty liver 07/16/2016  . Benign essential HTN 07/16/2016     Family History  Problem Relation Age of Onset  . Aneurysm Mother   . Alcohol abuse Father      Social History   Social History  . Marital status: Single    Spouse name: N/A  . Number of children: 4  . Years of education: N/A    Occupational History  . cuts vegetables    Social History Main Topics  . Smoking status: Never Smoker  . Smokeless tobacco: Never Used  . Alcohol use No  . Drug use: No  . Sexual activity: Not on file   Other Topics Concern  . Not on file   Social History Narrative   Originally from [place].   Came to the [place] in 2002.         Objective:  Physical Exam  Constitutional: She is oriented to person, place, and time. She appears well-developed and well-nourished. She is active and cooperative. No distress.  BP 120/74 (BP Location: Right Arm, Patient Position: Sitting, Cuff Size: Large)   Pulse 97   Temp 98.5 F (36.9 C) (Oral)   Resp 18   Ht 5\' 5"  (1.651 m)   Wt 210 lb 3.2 oz (95.3 kg)   SpO2 98%   BMI 34.98 kg/m  Patient was lying on the exam table when I entered, and reported dizziness briefly upon sitting up.  HENT:  Head: Normocephalic and atraumatic.  Right Ear: Hearing normal.  Left Ear: Hearing normal.  Eyes: Conjunctivae are normal. No scleral icterus.  Neck: Normal range of motion. Neck supple. No thyromegaly present.  Cardiovascular: Normal rate, regular rhythm and normal heart sounds.   Pulses:      Radial pulses are 2+  on the right side, and 2+ on the left side.  Pulmonary/Chest: Effort normal and breath sounds normal.  Abdominal: Soft. Normal appearance and bowel sounds are normal. There is no hepatosplenomegaly. There is tenderness in the epigastric area.  Lymphadenopathy:       Head (right side): No tonsillar, no preauricular, no posterior auricular and no occipital adenopathy present.       Head (left side): No tonsillar, no preauricular, no posterior auricular and no occipital adenopathy present.    She has no cervical adenopathy.       Right: No supraclavicular adenopathy present.       Left: No supraclavicular adenopathy present.  Neurological: She is alert and oriented to person, place, and time. No sensory deficit.  Skin: Skin is warm, dry and  intact. No rash noted. No cyanosis or erythema. Nails show no clubbing.  Psychiatric: She has a normal mood and affect. Her speech is normal and behavior is normal.             Assessment & Plan:  1. Viral gastroenteritis Supportive care.  Anticipatory guidance.  RTC if symptoms worsen/persist. - ondansetron (ZOFRAN-ODT) 4 MG disintegrating tablet; Take 1 tablet (4 mg total) by mouth every 8 (eight) hours as needed for nausea or vomiting.  Dispense: 20 tablet; Refill: 0 - Care order/instruction:   Fernande Brashelle S. Jakyia Gaccione, PA-C Physician Assistant-Certified [facility]

## 2016-07-15 NOTE — Patient Instructions (Addendum)
  When you are well from this illness, return for evaluation of the back and arm pain.  You are welcome to see any of our providers, but you may prefer to see Dr. Silas SacramentoJeff Greene, who has special training in sports medicine.   IF you received an x-ray today, you will receive an invoice from [facility]. Please contact [facility] at [phone] with questions or concerns regarding your invoice.   IF you received labwork today, you will receive an invoice from [facility]. Please contact [facility] at [phone] with questions or concerns regarding your invoice.   Our billing staff will not be able to assist you with questions regarding bills from these companies.  You will be contacted with the lab results as soon as they are available. The fastest way to get your results is to activate your My Chart account. Instructions are located on the last page of this paperwork. If you have not heard from us regarding the results in 2 weeks, please contact this office.

## 2016-07-16 DIAGNOSIS — I10 Essential (primary) hypertension: Secondary | ICD-10-CM | POA: Insufficient documentation

## 2016-07-16 DIAGNOSIS — K760 Fatty (change of) liver, not elsewhere classified: Secondary | ICD-10-CM | POA: Insufficient documentation

## 2016-10-19 DIAGNOSIS — R748 Abnormal levels of other serum enzymes: Secondary | ICD-10-CM | POA: Insufficient documentation

## 2016-10-19 DIAGNOSIS — E669 Obesity, unspecified: Secondary | ICD-10-CM | POA: Insufficient documentation

## 2017-02-28 ENCOUNTER — Encounter: Payer: BLUE CROSS/BLUE SHIELD | Admitting: Physician Assistant

## 2017-06-16 ENCOUNTER — Other Ambulatory Visit: Payer: Self-pay

## 2017-06-16 ENCOUNTER — Encounter: Payer: Self-pay | Admitting: Physician Assistant

## 2017-06-16 ENCOUNTER — Ambulatory Visit (INDEPENDENT_AMBULATORY_CARE_PROVIDER_SITE_OTHER): Payer: BLUE CROSS/BLUE SHIELD | Admitting: Physician Assistant

## 2017-06-16 VITALS — BP 120/86 | HR 73 | Temp 97.9°F | Resp 16 | Ht 66.0 in | Wt 222.0 lb

## 2017-06-16 DIAGNOSIS — Z1211 Encounter for screening for malignant neoplasm of colon: Secondary | ICD-10-CM

## 2017-06-16 DIAGNOSIS — Z23 Encounter for immunization: Secondary | ICD-10-CM

## 2017-06-16 DIAGNOSIS — Z1231 Encounter for screening mammogram for malignant neoplasm of breast: Secondary | ICD-10-CM

## 2017-06-16 DIAGNOSIS — Z124 Encounter for screening for malignant neoplasm of cervix: Secondary | ICD-10-CM

## 2017-06-16 DIAGNOSIS — Z13220 Encounter for screening for lipoid disorders: Secondary | ICD-10-CM

## 2017-06-16 DIAGNOSIS — Z1239 Encounter for other screening for malignant neoplasm of breast: Secondary | ICD-10-CM

## 2017-06-16 DIAGNOSIS — Z0000 Encounter for general adult medical examination without abnormal findings: Secondary | ICD-10-CM

## 2017-06-16 DIAGNOSIS — Z1329 Encounter for screening for other suspected endocrine disorder: Secondary | ICD-10-CM

## 2017-06-16 DIAGNOSIS — Z130 Encounter for screening for diseases of the blood and blood-forming organs and certain disorders involving the immune mechanism: Secondary | ICD-10-CM

## 2017-06-16 DIAGNOSIS — I10 Essential (primary) hypertension: Secondary | ICD-10-CM

## 2017-06-16 DIAGNOSIS — Z01419 Encounter for gynecological examination (general) (routine) without abnormal findings: Secondary | ICD-10-CM

## 2017-06-16 LAB — POCT URINALYSIS DIP (MANUAL ENTRY)
Bilirubin, UA: NEGATIVE
Glucose, UA: NEGATIVE mg/dL
Ketones, POC UA: NEGATIVE mg/dL
Leukocytes, UA: NEGATIVE
Nitrite, UA: NEGATIVE
Protein Ur, POC: NEGATIVE mg/dL
Spec Grav, UA: 1.025 (ref 1.010–1.025)
Urobilinogen, UA: 0.2 E.U./dL
pH, UA: 5.5 (ref 5.0–8.0)

## 2017-06-16 MED ORDER — METOPROLOL TARTRATE 50 MG PO TABS: 50.0000 mg | ORAL_TABLET | Freq: Two times a day (BID) | ORAL | 1 refills | Status: DC

## 2017-06-16 MED ORDER — METOPROLOL TARTRATE 50 MG PO TABS: 100.0000 mg | ORAL_TABLET | Freq: Two times a day (BID) | ORAL | 1 refills | Status: DC

## 2017-06-16 NOTE — Addendum Note (Signed)
Addended by: Sebastian AcheMCVEY, Jeremias Broyhill WHITNEY on: 06/16/2017 11:39 AM   Modules accepted: Orders

## 2017-06-16 NOTE — Patient Instructions (Addendum)
Take your blood pressure medicine twice a day. Please come back and see me in 2-3 months to recheck your blood pressure.  Please make an appointment to see dentist.  [facility]. You need better arch support.  Heat and massage for your muscles. Take ibuprofen and/or Tylenol.  We will contact you with the results of your blood work.   Thank you for coming in today. I hope you feel we met your needs.  Feel free to call PCP if you have any questions or further requests.  Please consider signing up for MyChart if you do not already have it, as this is a great way to communicate with me.  Best,  Whitney McVey, PA-C  What are the benefits of exercise?-Exercise has many benefits. It can: ?Burn calories, which helps people control their weight ?Help control blood sugar levels in people with diabetes ?Lower blood pressure, especially in people with high blood pressure ?Lower stress and help with depression ?Keep bones strong, so they don't get thin and break easily ?Lower the chance of dying from heart disease   What are the main types of exercise?-There are 3 main types of exercise. They are: ?Aerobic exercise - Aerobic exercise raises a person's heart rate. Examples of aerobic exercise are walking, running, or swimming. ?Resistance training - Resistance training helps make your muscles stronger. People can do this type of exercise using weights, exercise bands, or weight machines. ?Stretching - Stretching exercises help your muscles and joints move more easily. It's important to have all 3 types of exercise in your exercise program. That way, your body, muscles, and joints can be as healthy as possible.  For substantial health benefits, adults are recommended to perform moderate-intensity aerobic exercise or vigorous aerobic exercise as follows: ?Moderate-intensity aerobic exercise for 150 minutes every week AND muscle-strengthening activities involving all major muscle  groups at least two days per week, OR ?Vigorous-intensity aerobic exercise for 75 minutes every week AND muscle-strengthening activities involving all major muscle groups at least two days per week, OR ?An equivalent mix of moderate- and vigorous-intensity aerobic exercise AND muscle-strengthening activities involving all major muscle groups at least two days per week  What should I do when I exercise?-Each time you exercise, you should: ?Warm up - Warming up can help keep you from hurting your muscles when you exercise. To warm up, do a light aerobic exercise (such as walking slowly) or stretch for 5 to 10 minutes. ?Work out - During a workout, you can walk fast, swim, run, or use an exercise machine, for example. You should also stretch all of your joints, including your neck, shoulders, back, hips, and knees. At least 2 times a week, you can add resistance training exercises to your workout. ?Cool down - Cooling down helps keep you from feeling dizzy after you exercise and helps prevent muscle cramps. To cool down, you can stretch or do a light aerobic exercise for 5 minutes.  How often should I exercise?-Doctors recommend that people exercise at least 30 minutes a day, on 5 or more days of the week. If you can't exercise for 30 minutes straight, try to exercise for 10 minutes at a time, 3 or 4 times a day.  Health Maintenance, Female Adopting a healthy lifestyle and getting preventive care can go a long way to promote health and wellness. Talk with your health care provider about what schedule of regular examinations is right for you. This is a good chance for  you to check in with your provider about disease prevention and staying healthy. In between checkups, there are plenty of things you can do on your own. Experts have done a lot of research about which lifestyle changes and preventive measures are most likely to keep you healthy. Ask your health care provider for more information. Weight  and diet Eat a healthy diet  Be sure to include plenty of vegetables, fruits, low-fat dairy products, and lean protein.  Do not eat a lot of foods high in solid fats, added sugars, or salt.  Get regular exercise. This is one of the most important things you can do for your health. ? Most adults should exercise for at least 150 minutes each week. The exercise should increase your heart rate and make you sweat (moderate-intensity exercise). ? Most adults should also do strengthening exercises at least twice a week. This is in addition to the moderate-intensity exercise.  Maintain a healthy weight  Body mass index (BMI) is a measurement that can be used to identify possible weight problems. It estimates body fat based on height and weight. Your health care provider can help determine your BMI and help you achieve or maintain a healthy weight.  For females 35 years of age and older: ? A BMI below 18.5 is considered underweight. ? A BMI of 18.5 to 24.9 is normal. ? A BMI of 25 to 29.9 is considered overweight. ? A BMI of 30 and above is considered obese.  Watch levels of cholesterol and blood lipids  You should start having your blood tested for lipids and cholesterol at 53 years of age, then have this test every 5 years.  You may need to have your cholesterol levels checked more often if: ? Your lipid or cholesterol levels are high. ? You are older than 53 years of age. ? You are at high risk for heart disease.  Cancer screening Lung Cancer  Lung cancer screening is recommended for adults 13-66 years old who are at high risk for lung cancer because of a history of smoking.  A yearly low-dose CT scan of the lungs is recommended for people who: ? Currently smoke. ? Have quit within the past 15 years. ? Have at least a 30-pack-year history of smoking. A pack year is smoking an average of one pack of cigarettes a day for 1 year.  Yearly screening should continue until it has been 15  years since you quit.  Yearly screening should stop if you develop a health problem that would prevent you from having lung cancer treatment.  Breast Cancer  Practice breast self-awareness. This means understanding how your breasts normally appear and feel.  It also means doing regular breast self-exams. Let your health care provider know about any changes, no matter how small.  If you are in your 20s or 30s, you should have a clinical breast exam (CBE) by a health care provider every 1-3 years as part of a regular health exam.  If you are 46 or older, have a CBE every year. Also consider having a breast X-ray (mammogram) every year.  If you have a family history of breast cancer, talk to your health care provider about genetic screening.  If you are at high risk for breast cancer, talk to your health care provider about having an MRI and a mammogram every year.  Breast cancer gene (BRCA) assessment is recommended for women who have family members with BRCA-related cancers. BRCA-related cancers include: ? Breast. ? Ovarian. ?  Tubal. ? Peritoneal cancers.  Results of the assessment will determine the need for genetic counseling and BRCA1 and BRCA2 testing.  Cervical Cancer Your health care provider may recommend that you be screened regularly for cancer of the pelvic organs (ovaries, uterus, and vagina). This screening involves a pelvic examination, including checking for microscopic changes to the surface of your cervix (Pap test). You may be encouraged to have this screening done every 3 years, beginning at age 66.  For women ages 63-65, health care providers may recommend pelvic exams and Pap testing every 3 years, or they may recommend the Pap and pelvic exam, combined with testing for human papilloma virus (HPV), every 5 years. Some types of HPV increase your risk of cervical cancer. Testing for HPV may also be done on women of any age with unclear Pap test results.  Other health  care providers may not recommend any screening for nonpregnant women who are considered low risk for pelvic cancer and who do not have symptoms. Ask your health care provider if a screening pelvic exam is right for you.  If you have had past treatment for cervical cancer or a condition that could lead to cancer, you need Pap tests and screening for cancer for at least 20 years after your treatment. If Pap tests have been discontinued, your risk factors (such as having a new sexual partner) need to be reassessed to determine if screening should resume. Some women have medical problems that increase the chance of getting cervical cancer. In these cases, your health care provider may recommend more frequent screening and Pap tests.  Colorectal Cancer  This type of cancer can be detected and often prevented.  Routine colorectal cancer screening usually begins at 53 years of age and continues through 53 years of age.  Your health care provider may recommend screening at an earlier age if you have risk factors for colon cancer.  Your health care provider may also recommend using home test kits to check for hidden blood in the stool.  A small camera at the end of a tube can be used to examine your colon directly (sigmoidoscopy or colonoscopy). This is done to check for the earliest forms of colorectal cancer.  Routine screening usually begins at age 58.  Direct examination of the colon should be repeated every 5-10 years through 53 years of age. However, you may need to be screened more often if early forms of precancerous polyps or small growths are found.  Skin Cancer  Check your skin from head to toe regularly.  Tell your health care provider about any new moles or changes in moles, especially if there is a change in a mole's shape or color.  Also tell your health care provider if you have a mole that is larger than the size of a pencil eraser.  Always use sunscreen. Apply sunscreen liberally  and repeatedly throughout the day.  Protect yourself by wearing long sleeves, pants, a wide-brimmed hat, and sunglasses whenever you are outside.  Heart disease, diabetes, and high blood pressure  High blood pressure causes heart disease and increases the risk of stroke. High blood pressure is more likely to develop in: ? People who have blood pressure in the high end of the normal range (130-139/85-89 mm Hg). ? People who are overweight or obese. ? People who are African American.  If you are 64-13 years of age, have your blood pressure checked every 3-5 years. If you are 2 years of age or  older, have your blood pressure checked every year. You should have your blood pressure measured twice-once when you are at a hospital or clinic, and once when you are not at a hospital or clinic. Record the average of the two measurements. To check your blood pressure when you are not at a hospital or clinic, you can use: ? An automated blood pressure machine at a pharmacy. ? A home blood pressure monitor.  If you are between 32 years and 37 years old, ask your health care provider if you should take aspirin to prevent strokes.  Have regular diabetes screenings. This involves taking a blood sample to check your fasting blood sugar level. ? If you are at a normal weight and have a low risk for diabetes, have this test once every three years after 53 years of age. ? If you are overweight and have a high risk for diabetes, consider being tested at a younger age or more often. Preventing infection Hepatitis B  If you have a higher risk for hepatitis B, you should be screened for this virus. You are considered at high risk for hepatitis B if: ? You were born in a country where hepatitis B is common. Ask your health care provider which countries are considered high risk. ? Your parents were born in a high-risk country, and you have not been immunized against hepatitis B (hepatitis B vaccine). ? You have HIV  or AIDS. ? You use needles to inject street drugs. ? You live with someone who has hepatitis B. ? You have had sex with someone who has hepatitis B. ? You get hemodialysis treatment. ? You take certain medicines for conditions, including cancer, organ transplantation, and autoimmune conditions.  Hepatitis C  Blood testing is recommended for: ? Everyone born from [birth year] through 1967. ? Anyone with known risk factors for hepatitis C.  Sexually transmitted infections (STIs)  You should be screened for sexually transmitted infections (STIs) including gonorrhea and chlamydia if: ? You are sexually active and are younger than 53 years of age. ? You are older than 53 years of age and your health care provider tells you that you are at risk for this type of infection. ? Your sexual activity has changed since you were last screened and you are at an increased risk for chlamydia or gonorrhea. Ask your health care provider if you are at risk.  If you do not have HIV, but are at risk, it may be recommended that you take a prescription medicine daily to prevent HIV infection. This is called pre-exposure prophylaxis (PrEP). You are considered at risk if: ? You are sexually active and do not regularly use condoms or know the HIV status of your partner(s). ? You take drugs by injection. ? You are sexually active with a partner who has HIV.  Talk with your health care provider about whether you are at high risk of being infected with HIV. If you choose to begin PrEP, you should first be tested for HIV. You should then be tested every 3 months for as long as you are taking PrEP. Pregnancy  If you are premenopausal and you may become pregnant, ask your health care provider about preconception counseling.  If you may become pregnant, take 400 to 800 micrograms (mcg) of folic acid every day.  If you want to prevent pregnancy, talk to your health care provider about birth control  (contraception). Osteoporosis and menopause  Osteoporosis is a disease in which the bones lose minerals  and strength with aging. This can result in serious bone fractures. Your risk for osteoporosis can be identified using a bone density scan.  If you are 75 years of age or older, or if you are at risk for osteoporosis and fractures, ask your health care provider if you should be screened.  Ask your health care provider whether you should take a calcium or vitamin D supplement to lower your risk for osteoporosis.  Menopause may have certain physical symptoms and risks.  Hormone replacement therapy may reduce some of these symptoms and risks. Talk to your health care provider about whether hormone replacement therapy is right for you. Follow these instructions at home:  Schedule regular health, dental, and eye exams.  Stay current with your immunizations.  Do not use any tobacco products including cigarettes, chewing tobacco, or electronic cigarettes.  If you are pregnant, do not drink alcohol.  If you are breastfeeding, limit how much and how often you drink alcohol.  Limit alcohol intake to no more than 1 drink per day for nonpregnant women. One drink equals 12 ounces of beer, 5 ounces of wine, or 1 ounces of hard liquor.  Do not use street drugs.  Do not share needles.  Ask your health care provider for help if you need support or information about quitting drugs.  Tell your health care provider if you often feel depressed.  Tell your health care provider if you have ever been abused or do not feel safe at home. This information is not intended to replace advice given to you by your health care provider. Make sure you discuss any questions you have with your health care provider. Document Released: 01/31/2011 Document Revised: 12/24/2015 Document Reviewed: 04/21/2015 Elsevier Interactive Patient Education  2018 [employer].  IF you received an x-ray today, you will  receive an invoice from [facility]. Please contact [facility] at [phone] with questions or concerns regarding your invoice.   IF you received labwork today, you will receive an invoice from [place] [facility]. Please contact [facility] at [phone] with questions or concerns regarding your invoice.   Our billing staff will not be able to assist you with questions regarding bills from these companies.  You will be contacted with the lab results as soon as they are available. The fastest way to get your results is to activate your My Chart account. Instructions are located on the last page of this paperwork. If you have not heard from [place] regarding the results in 2 weeks, please contact this office.    Influenza (Flu) Vaccine (Inactivated or Recombinant): What You Need to Know 1. Why get vaccinated? Influenza ("flu") is a contagious disease that spreads around the [place] every year, usually between October and May. Flu is caused by influenza viruses, and is spread mainly by coughing, sneezing, and close contact. Anyone can get flu. Flu strikes suddenly and can last several days. Symptoms vary by age, but can include:  fever/chills  sore throat  muscle aches  fatigue  cough  headache  runny or stuffy nose  Flu can also lead to pneumonia and blood infections, and cause diarrhea and seizures in children. If you have a medical condition, such as heart or lung disease, flu can make it worse. Flu is more dangerous for some people. Infants and young children, people 66 years of age and older, pregnant women, and people with certain health conditions or a weakened immune system are at greatest risk. Each year thousands of people in the  [place] die from flu, and many more are hospitalized. Flu vaccine can:  keep you from getting flu,  make flu less severe if you do get it, and  keep you from spreading flu to your family and other people. 2. Inactivated and  recombinant flu vaccines A dose of flu vaccine is recommended every flu season. Children 6 months through 95 years of age may need two doses during the same flu season. Everyone else needs only one dose each flu season. Some inactivated flu vaccines contain a very small amount of a mercury-based preservative called thimerosal. Studies have not shown thimerosal in vaccines to be harmful, but flu vaccines that do not contain thimerosal are available. There is no live flu virus in flu shots. They cannot cause the flu. There are many flu viruses, and they are always changing. Each year a new flu vaccine is made to protect against three or four viruses that are likely to cause disease in the upcoming flu season. But even when the vaccine doesn't exactly match these viruses, it may still provide some protection. Flu vaccine cannot prevent:  flu that is caused by a virus not covered by the vaccine, or  illnesses that look like flu but are not.  It takes about 2 weeks for protection to develop after vaccination, and protection lasts through the flu season. 3. Some people should not get this vaccine Tell the person who is giving you the vaccine:  If you have any severe, life-threatening allergies. If you ever had a life-threatening allergic reaction after a dose of flu vaccine, or have a severe allergy to any part of this vaccine, you may be advised not to get vaccinated. Most, but not all, types of flu vaccine contain a small amount of egg protein.  If you ever had Guillain-Barr Syndrome (also called GBS). Some people with a history of GBS should not get this vaccine. This should be discussed with your doctor.  If you are not feeling well. It is usually okay to get flu vaccine when you have a mild illness, but you might be asked to come back when you feel better.  4. Risks of a vaccine reaction With any medicine, including vaccines, there is a chance of reactions. These are usually mild and go away on  their own, but serious reactions are also possible. Most people who get a flu shot do not have any problems with it. Minor problems following a flu shot include:  soreness, redness, or swelling where the shot was given  hoarseness  sore, red or itchy eyes  cough  fever  aches  headache  itching  fatigue  If these problems occur, they usually begin soon after the shot and last 1 or 2 days. More serious problems following a flu shot can include the following:  There may be a small increased risk of Guillain-Barre Syndrome (GBS) after inactivated flu vaccine. This risk has been estimated at 1 or 2 additional cases per million people vaccinated. This is much lower than the risk of severe complications from flu, which can be prevented by flu vaccine.  Young children who get the flu shot along with pneumococcal vaccine (PCV13) and/or DTaP vaccine at the same time might be slightly more likely to have a seizure caused by fever. Ask your doctor for more information. Tell your doctor if a child who is getting flu vaccine has ever had a seizure.  Problems that could happen after any injected vaccine:  People sometimes faint after  a medical procedure, including vaccination. Sitting or lying down for about 15 minutes can help prevent fainting, and injuries caused by a fall. Tell your doctor if you feel dizzy, or have vision changes or ringing in the ears.  Some people get severe pain in the shoulder and have difficulty moving the arm where a shot was given. This happens very rarely.  Any medication can cause a severe allergic reaction. Such reactions from a vaccine are very rare, estimated at about 1 in a million doses, and would happen within a few minutes to a few hours after the vaccination. As with any medicine, there is a very remote chance of a vaccine causing a serious injury or death. The safety of vaccines is always being monitored. For more information, visit:  http://www.aguilar.org/ 5. What if there is a serious reaction? What should I look for? Look for anything that concerns you, such as signs of a severe allergic reaction, very high fever, or unusual behavior. Signs of a severe allergic reaction can include hives, swelling of the face and throat, difficulty breathing, a fast heartbeat, dizziness, and weakness. These would start a few minutes to a few hours after the vaccination. What should I do?  If you think it is a severe allergic reaction or other emergency that can't wait, call 9-1-1 and get the person to the nearest hospital. Otherwise, call your doctor.  Reactions should be reported to the Vaccine Adverse Event Reporting System (VAERS). Your doctor should file this report, or you can do it yourself through the VAERS web site at www.vaers.SamedayNews.es, or by calling 843-365-6752. ? VAERS does not give medical advice. 6. The National Vaccine Injury Compensation Program The [employer] Vaccine Injury Compensation Program (VICP) is a federal program that was created to compensate people who may have been injured by certain vaccines. Persons who believe they may have been injured by a vaccine can learn about the program and about filing a claim by calling 970-515-8077 or visiting the [place] website at GoldCloset.com.ee. There is a time limit to file a claim for compensation. 7. How can I learn more?  Ask your healthcare provider. He or she can give you the vaccine package insert or suggest other sources of information.  Call your local or state health department.  Contact the Centers for Disease Control and Prevention (CDC): ? Call 312-083-3196 (1-800-CDC-INFO) or ? Visit CDC's website at https://gibson.com/ Vaccine Information Statement, Inactivated Influenza Vaccine (03/07/2014) This information is not intended to replace advice given to you by your health care provider. Make sure you discuss any questions you have with your  health care provider. Document Released: 05/12/2006 Document Revised: 04/07/2016 Document Reviewed: 04/07/2016 Elsevier Interactive Patient Education  2017 [employer].

## 2017-06-16 NOTE — Progress Notes (Addendum)
[facility] at [place], [place] [address] [phone]  Date:  06/16/2017   Name:  Patricia Gross   DOB:  01/26/1966   MRN:  [id number]  PCP:  Patient, No Pcp Per    Chief Complaint: Annual Exam   History of Present Illness:  This is a 51 y.o. female with PMH HTN, arthritis, fatty liver disease,  who is presenting for CPE. Last annual exam over one year ago. She believes she has had elevated lipids and glucose labs in the past, does not take medicaiton, has been trying to correct with diet. Does not recall the lab values.  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   HTN - Controlled with Metoprolol 50 mg bid. Today's pressure is 120/86.   Complaints: none LMP: several years ago Contraception: none Last pap: due Sexual history: not active.  Immunizations: needs flu and tdap Dentist: does not go. Brushes more than twice daily.  Eye: 20/15 b/l  Diet/Exercise: she is eating less carbs. She is not exercising.  Fam hx: mother HTN Tobacco/alcohol/substance use: non smoker, no alcohol use, no drug use.   Mammogram: over one year ago. Negative.  Colonoscopy: never  Review of Systems:  Review of Systems  Constitutional: Negative for chills, diaphoresis, fatigue and fever.  HENT: Negative for congestion, postnasal drip, rhinorrhea, sinus pressure, sneezing and sore throat.   Respiratory: Negative for cough, chest tightness, shortness of breath and wheezing.   Cardiovascular: Negative for chest pain and palpitations.  Gastrointestinal: Negative for abdominal pain, diarrhea, nausea and vomiting.  Genitourinary: Negative for decreased urine volume, difficulty urinating, dysuria, enuresis, flank pain, frequency, hematuria and urgency.  Musculoskeletal: Negative for back pain.  Neurological: Negative for dizziness, weakness, light-headedness and headaches.    Patient Active Problem List   Diagnosis Date Noted  . Flu vaccine need 06/16/2017  .  Fatty liver 07/16/2016  . Benign essential HTN 07/16/2016    Prior to Admission medications   Medication Sig Start Date End Date Taking? Authorizing Provider  metoprolol (LOPRESSOR) 50 MG tablet Take 2 tablets (100 mg total) by mouth 2 (two) times daily. 03/04/14  Yes Julianne Rice, MD    No Known Allergies  Past Surgical History:  Procedure Laterality Date  . GALLBLADDER SURGERY      Social History   Tobacco Use  . Smoking status: Never Smoker  . Smokeless tobacco: Never Used  Substance Use Topics  . Alcohol use: No  . Drug use: No    Family History  Problem Relation Age of Onset  . Aneurysm Mother   . Alcohol abuse Father     Medication list has been reviewed and updated.  Physical Examination:  Physical Exam  Constitutional: She is oriented to person, place, and time. Vital signs are normal. She appears well-developed and well-nourished. No distress.  Central obesity  HENT:  Head: Normocephalic and atraumatic.  Mouth/Throat: Oropharynx is clear and moist. Dental caries present.  Eyes: Conjunctivae and EOM are normal. Pupils are equal, round, and reactive to light.  Neck: Normal range of motion. Neck supple.  Cardiovascular: Normal rate, regular rhythm and normal heart sounds.  No murmur heard. Pulmonary/Chest: Effort normal and breath sounds normal. She has no wheezes.  Abdominal: Soft.  Genitourinary: Vagina normal and uterus normal. Cervix exhibits no motion tenderness and no friability. Right adnexum displays no mass and no tenderness. Left adnexum displays no mass and no tenderness.  Musculoskeletal: Normal range of motion.  Neurological: She is alert and oriented to person, place, and time. She has normal reflexes.  Skin: Skin is warm and dry.  Psychiatric: She has a normal mood and affect. Her behavior is normal. Judgment and thought content normal.  Vitals reviewed.   BP 120/86   Pulse 73   Temp 97.9 F (36.6 C) (Oral)   Resp 16   Ht 5' 6"   (1.676 m)   Wt 222 lb (100.7 kg)   SpO2 97%   BMI 35.83 kg/m   Assessment and Plan: 1. Annual physical exam - Pt presents for her first annual exam with PCP. H/o HTN, controlled. She believes she has had elevated lipids and glucose labs in the past, does not take medicaiton, has been trying to correct with diet. Routine labs are pending, will contact with results. MM > 1 year ago. Never had colonoscopy.   2. Essential hypertension - metoprolol tartrate (LOPRESSOR) 50 MG tablet; Take 2 tablets (100 mg total) 2 (two) times daily by mouth.  Dispense: 90 tablet; Refill: 1 - She does not take medication regularly. Advised her to start Metoprolol 20m big. RTC in 3 months for recheck.  3. Flu vaccine need - Flu Vaccine QUAD 6+ mos PF IM (Fluarix Quad PF)  4. Screening for deficiency anemia - CBC with Differential/Platelet  5. Screening for breast cancer - MM Digital Screening; Future  6. Screen for colon cancer - Ambulatory referral to Gastroenterology  7. Screening for endocrine disorder - CMP14+EGFR - POCT urinalysis dipstick  8. Screening, lipid - Lipid panel  9. Screening for thyroid disorder - TSH  10. Screening for cervical cancer 11. Encounter for gynecological exam without abnormal findings - Pap IG and HPV (high risk) DNA detection   WMercer Pod PA-C  [facility]11/16/2018 9:53 AM

## 2017-06-17 LAB — CBC WITH DIFFERENTIAL/PLATELET
Basophils Absolute: 0 10*3/uL (ref 0.0–0.2)
Basos: 0 %
EOS (ABSOLUTE): 0.3 10*3/uL (ref 0.0–0.4)
Eos: 6 %
Hematocrit: 43.6 % (ref 34.0–46.6)
Hemoglobin: 14.4 g/dL (ref 11.1–15.9)
Immature Grans (Abs): 0 10*3/uL (ref 0.0–0.1)
Immature Granulocytes: 0 %
Lymphocytes Absolute: 2.5 10*3/uL (ref 0.7–3.1)
Lymphs: 40 %
MCH: 28.3 pg (ref 26.6–33.0)
MCHC: 33 g/dL (ref 31.5–35.7)
MCV: 86 fL (ref 79–97)
Monocytes Absolute: 0.3 10*3/uL (ref 0.1–0.9)
Monocytes: 5 %
Neutrophils Absolute: 3 10*3/uL (ref 1.4–7.0)
Neutrophils: 49 %
Platelets: 219 10*3/uL (ref 150–379)
RBC: 5.08 x10E6/uL (ref 3.77–5.28)
RDW: 14.1 % (ref 12.3–15.4)
WBC: 6.2 10*3/uL (ref 3.4–10.8)

## 2017-06-17 LAB — LIPID PANEL
Chol/HDL Ratio: 5.2 ratio — ABNORMAL HIGH (ref 0.0–4.4)
Cholesterol, Total: 176 mg/dL (ref 100–199)
HDL: 34 mg/dL — ABNORMAL LOW (ref >39)
LDL Calculated: 84 mg/dL (ref 0–99)
Triglycerides: 289 mg/dL — ABNORMAL HIGH (ref 0–149)
VLDL Cholesterol Cal: 58 mg/dL — ABNORMAL HIGH (ref 5–40)

## 2017-06-17 LAB — CMP14+EGFR
ALT: 45 IU/L — ABNORMAL HIGH (ref 0–32)
AST: 30 IU/L (ref 0–40)
Albumin/Globulin Ratio: 1.8 (ref 1.2–2.2)
Albumin: 4.9 g/dL (ref 3.5–5.5)
Alkaline Phosphatase: 71 IU/L (ref 39–117)
BUN/Creatinine Ratio: 23 (ref 9–23)
BUN: 14 mg/dL (ref 6–24)
Bilirubin Total: 0.5 mg/dL (ref 0.0–1.2)
CO2: 25 mmol/L (ref 20–29)
Calcium: 9.6 mg/dL (ref 8.7–10.2)
Chloride: 101 mmol/L (ref 96–106)
Creatinine, Ser: 0.62 mg/dL (ref 0.57–1.00)
GFR calc Af Amer: 119 mL/min/{1.73_m2} (ref >59)
GFR calc non Af Amer: 103 mL/min/{1.73_m2} (ref >59)
Globulin, Total: 2.8 g/dL (ref 1.5–4.5)
Glucose: 90 mg/dL (ref 65–99)
Potassium: 4 mmol/L (ref 3.5–5.2)
Sodium: 143 mmol/L (ref 134–144)
Total Protein: 7.7 g/dL (ref 6.0–8.5)

## 2017-06-17 LAB — TSH: TSH: 2.88 u[IU]/mL (ref 0.450–4.500)

## 2017-06-19 ENCOUNTER — Other Ambulatory Visit: Payer: Self-pay | Admitting: Physician Assistant

## 2017-06-19 ENCOUNTER — Encounter: Payer: Self-pay | Admitting: Physician Assistant

## 2017-06-19 DIAGNOSIS — E785 Hyperlipidemia, unspecified: Secondary | ICD-10-CM

## 2017-06-19 MED ORDER — ATORVASTATIN CALCIUM 40 MG PO TABS: 40.0000 mg | ORAL_TABLET | Freq: Every day | ORAL | 3 refills | Status: DC

## 2017-06-19 NOTE — Progress Notes (Signed)
I tried to call pt, no option to leave Vm. Please call pt and let her know her cholesterol levels are elevated. I sent in a Rx to pharmacy for cholesterol-lowering medication. Start taking this and continue to work hard on eating healthier, cut down on fatty foods, fried foods, limit red meat to once a week, eat balanced meals including a serving of vegetables and/or fruits with every meal. Come back and see me in 3 months for recheck - please be fasting at this appt. (Letter send in mail with results and plan).  Thank you!

## 2017-06-20 LAB — PAP IG AND HPV HIGH-RISK
HPV, high-risk: NEGATIVE
PAP Smear Comment: 0

## 2017-06-20 NOTE — Progress Notes (Signed)
PAP negative. Next PAP 06/2020

## 2017-06-23 ENCOUNTER — Telehealth: Payer: Self-pay

## 2017-06-23 NOTE — Telephone Encounter (Signed)
Pt walk in at office, requesting lab results.  Provided lab letter and reviewed chol and glucose with pt.  Advised of Rx sent to pharmacy for cholesterol medicine and f/u instructions.

## 2017-07-17 ENCOUNTER — Encounter: Payer: Self-pay | Admitting: Physician Assistant

## 2017-07-29 ENCOUNTER — Other Ambulatory Visit: Payer: Self-pay

## 2017-07-29 ENCOUNTER — Encounter: Payer: Self-pay | Admitting: Emergency Medicine

## 2017-07-29 ENCOUNTER — Ambulatory Visit: Payer: BLUE CROSS/BLUE SHIELD | Admitting: Emergency Medicine

## 2017-07-29 VITALS — BP 146/88 | HR 76 | Temp 98.7°F | Resp 16 | Ht 66.0 in | Wt 223.1 lb

## 2017-07-29 DIAGNOSIS — R319 Hematuria, unspecified: Secondary | ICD-10-CM

## 2017-07-29 DIAGNOSIS — R1032 Left lower quadrant pain: Secondary | ICD-10-CM | POA: Insufficient documentation

## 2017-07-29 HISTORY — DX: Left lower quadrant pain: R10.32

## 2017-07-29 HISTORY — DX: Hematuria, unspecified: R31.9

## 2017-07-29 LAB — POCT URINALYSIS DIP (MANUAL ENTRY)
BILIRUBIN UA: NEGATIVE
BILIRUBIN UA: NEGATIVE mg/dL
GLUCOSE UA: NEGATIVE mg/dL
LEUKOCYTES UA: NEGATIVE
Nitrite, UA: NEGATIVE
Protein Ur, POC: NEGATIVE mg/dL
Spec Grav, UA: >=1.03 — AB (ref 1.010–1.025)
Urobilinogen, UA: 0.2 E.U./dL
pH, UA: 5.5 (ref 5.0–8.0)

## 2017-07-29 LAB — POCT CBC
GRANULOCYTE PERCENT: 51.7 % (ref 37–80)
HCT, POC: 41 % (ref 37.7–47.9)
HEMOGLOBIN: 13.4 g/dL (ref 12.2–16.2)
Lymph, poc: 2.4 (ref 0.6–3.4)
MCH: 28.2 pg (ref 27–31.2)
MCHC: 32.8 g/dL (ref 31.8–35.4)
MCV: 86.2 fL (ref 80–97)
MID (cbc): 0.4 (ref 0–0.9)
MPV: 7.5 fL (ref 0–99.8)
POC GRANULOCYTE: 3 (ref 2–6.9)
POC LYMPH PERCENT: 41.3 %L (ref 10–50)
POC MID %: 7 %M (ref 0–12)
Platelet Count, POC: 208 10*3/uL (ref 142–424)
RBC: 4.76 M/uL (ref 4.04–5.48)
RDW, POC: 13.3 %
WBC: 5.8 10*3/uL (ref 4.6–10.2)

## 2017-07-29 NOTE — Patient Instructions (Signed)
Dolor abdominal en los adultos °Abdominal Pain, Adult °El dolor de estómago (abdominal) puede tener muchas causas. La mayoría de las veces, el dolor de estómago no es peligroso. Muchos de estos casos de dolor de estómago pueden controlarse y tratarse en casa. Sin embargo, a veces, el dolor de estómago puede ser grave. El médico intentará descubrir la causa del dolor de estómago. °Siga estas indicaciones en su casa: °· Tome los medicamentos de venta libre y los recetados solamente como se lo haya indicado el médico. No tome medicamentos que lo ayuden a defecar (laxantes), salvo que el médico se lo indique. °· Beba suficiente líquido para mantener el pis (orina) claro o de color amarillo pálido. °· Esté atento al dolor de estómago para detectar cualquier cambio. °· Concurra a todas las visitas de control como se lo haya indicado el médico. Esto es importante. °Comuníquese con un médico si: °· El dolor de estómago cambia o empeora. °· No tiene apetito o baja de peso sin proponérselo. °· Tiene dificultades para defecar (está estreñido) o heces líquidas (diarrea) durante más de 2 o 3 días. °· Siente dolor al orinar o defecar. °· El dolor de estómago lo despierta de noche. °· El dolor empeora con las comidas, después de comer o con determinados alimentos. °· Vomita y no puede retener nada de lo que ingiere. °· Tiene fiebre. °Solicite ayuda de inmediato si: °· El dolor no desaparece en el tiempo indicado por el médico. °· No puede detener los vómitos. °· Siente dolor solamente en zonas específicas del abdomen, como el lado derecho o la parte inferior izquierda. °· Tiene heces con sangre, de color negro o con aspecto alquitranado. °· Tiene dolor muy intenso en el vientre, cólicos o meteorismo. °· Presenta signos de no tener suficientes líquidos o agua en el cuerpo (deshidratación), por ejemplo: °? La orina es oscura, es muy escasa o no orina. °? Labios agrietados. °? [place]oca seca. °? Ojos  hundidos. °? Somnolencia. °? Debilidad. °Esta información no tiene como fin reemplazar el consejo del médico. Asegúrese de hacerle al médico cualquier pregunta que tenga. °Document Released: 10/14/2008 Document Revised: 10/12/2016 Document Reviewed: 12/30/2015 °Elsevier Interactive Patient Education © 2018 Elsevier Inc. ° °

## 2017-07-29 NOTE — Progress Notes (Signed)
Fuller PlanMaria Gross 51 y.o.   Chief Complaint  Patient presents with  . LLQ Pain    started on Christmas    HISTORY OF PRESENT ILLNESS: This is a 51 y.o. female complaining of episode of sharp severe pain to left lower abdomen on the 25th evening; lasted 1-2 hours; better now; no associated symptoms.   Abdominal Pain  This is a new problem. The current episode started in the past 7 days. The problem occurs intermittently. The problem has been resolved. The pain is located in the LLQ. Pain scale: no pain now; severe when it happened. The pain is severe. The quality of the pain is sharp. The abdominal pain radiates to the left flank. Pertinent negatives include no anorexia, constipation, diarrhea, dysuria, fever, headaches, hematochezia, hematuria, melena, myalgias, nausea or vomiting. Nothing aggravates the pain. The pain is relieved by nothing. Treatments tried: laxative. Prior workup: none. There is no history of abdominal surgery, colon cancer, Crohn's disease, pancreatitis, PUD or ulcerative colitis.     Prior to Admission medications   Medication Sig Start Date End Date Taking? Authorizing Provider  atorvastatin (LIPITOR) 40 MG tablet Take 1 tablet (40 mg total) daily by mouth. 06/19/17  Yes McVey, Madelaine BhatElizabeth Whitney, PA-C  metoprolol tartrate (LOPRESSOR) 50 MG tablet Take 1 tablet (50 mg total) 2 (two) times daily by mouth. 06/16/17  Yes McVey, Madelaine BhatElizabeth Whitney, PA-C    No Known Allergies  Patient Active Problem List   Diagnosis Date Noted  . Fatty liver 07/16/2016  . Benign essential HTN 07/16/2016    Past Medical History:  Diagnosis Date  . Arthritis   . Asthma   . Hypertension     Past Surgical History:  Procedure Laterality Date  . GALLBLADDER SURGERY      Social History   Socioeconomic History  . Marital status: Single    Spouse name: Not on file  . Number of children: 4  . Years of education: Not on file  . Highest education level: Not on file  Social Needs    . Financial resource strain: Not on file  . Food insecurity - worry: Not on file  . Food insecurity - inability: Not on file  . Transportation needs - medical: Not on file  . Transportation needs - non-medical: Not on file  Occupational History  . Occupation: cuts vegetables  Tobacco Use  . Smoking status: Never Smoker  . Smokeless tobacco: Never Used  Substance and Sexual Activity  . Alcohol use: No  . Drug use: No  . Sexual activity: Not on file  Other Topics Concern  . Not on file  Social History Narrative   Originally from [place].   Came to the [place] in 2002.    Family History  Problem Relation Age of Onset  . Aneurysm Mother   . Alcohol abuse Father      Review of Systems  Constitutional: Negative for chills and fever.  HENT: Negative.  Negative for sore throat.   Eyes: Negative.   Respiratory: Negative.  Negative for cough, hemoptysis and shortness of breath.   Cardiovascular: Negative.  Negative for chest pain and palpitations.  Gastrointestinal: Positive for abdominal pain. Negative for anorexia, blood in stool, constipation, diarrhea, hematochezia, melena, nausea and vomiting.  Genitourinary: Negative for dysuria and hematuria.  Musculoskeletal: Positive for joint pain (possible arthritis). Negative for back pain, myalgias and neck pain.  Skin: Negative.  Negative for rash.  Neurological: Negative for dizziness and headaches.  Endo/Heme/Allergies: Negative.   All other  systems reviewed and are negative.   Vitals:   07/29/17 0949  BP: (!) 146/88  Pulse: 76  Resp: 16  Temp: 98.7 F (37.1 C)  SpO2: 97%   Results for orders placed or performed in visit on 07/29/17 (from the past 24 hour(s))  POCT CBC     Status: None   Collection Time: 07/29/17 10:47 AM  Result Value Ref Range   WBC 5.8 4.6 - 10.2 K/uL   Lymph, poc 2.4 0.6 - 3.4   POC LYMPH PERCENT 41.3 10 - 50 %L   MID (cbc) 0.4 0 - 0.9   POC MID % 7.0 0 - 12 %M   POC Granulocyte 3.0 2 - 6.9    Granulocyte percent 51.7 37 - 80 %G   RBC 4.76 4.04 - 5.48 M/uL   Hemoglobin 13.4 12.2 - 16.2 g/dL   HCT, POC 96.041.0 45.437.7 - 47.9 %   MCV 86.2 80 - 97 fL   MCH, POC 28.2 27 - 31.2 pg   MCHC 32.8 31.8 - 35.4 g/dL   RDW, POC 09.813.3 %   Platelet Count, POC 208 142 - 424 K/uL   MPV 7.5 0 - 99.8 fL  POCT urinalysis dipstick     Status: Abnormal   Collection Time: 07/29/17 11:39 AM  Result Value Ref Range   Color, UA yellow yellow   Clarity, UA clear clear   Glucose, UA negative negative mg/dL   Bilirubin, UA negative negative   Ketones, POC UA negative negative mg/dL   Spec Grav, UA >=1.191>=1.030 (A) 1.010 - 1.025   Blood, UA trace-intact (A) negative   pH, UA 5.5 5.0 - 8.0   Protein Ur, POC negative negative mg/dL   Urobilinogen, UA 0.2 0.2 or 1.0 E.U./dL   Nitrite, UA Negative Negative   Leukocytes, UA Negative Negative     Physical Exam  Constitutional: She is oriented to person, place, and time. She appears well-developed and well-nourished.  HENT:  Head: Normocephalic and atraumatic.  Eyes: Conjunctivae and EOM are normal. Pupils are equal, round, and reactive to light.  Neck: Normal range of motion. Neck supple.  Cardiovascular: Normal rate, regular rhythm, normal heart sounds and intact distal pulses.  Pulmonary/Chest: Effort normal and breath sounds normal.  Abdominal: Soft. Bowel sounds are normal. She exhibits no distension and no mass. There is no tenderness. There is no rebound and no guarding. No hernia.  Neurological: She is alert and oriented to person, place, and time. No sensory deficit. She exhibits normal muscle tone.  Skin: Skin is warm and dry. Capillary refill takes less than 2 seconds.  Psychiatric: She has a normal mood and affect. Her behavior is normal.  Vitals reviewed.    ASSESSMENT & PLAN: Patricia Gross was seen today for llq pain.  Diagnoses and all orders for this visit:  Left lower quadrant pain -     POCT urinalysis dipstick -     POCT CBC -     CT Abdomen  Pelvis W Contrast; Future  Hematuria, unspecified type  Other orders -     Cancel: MM Digital Screening; Future    Patient Instructions  Dolor abdominal en los adultos Abdominal Pain, Adult El dolor de estmago (abdominal) puede tener muchas causas. La mayora de las veces, el dolor de Emersonestmago no es peligroso. Muchos de Franklin Resourcesestos casos de dolor de estmago pueden controlarse y tratarse en casa. Sin embargo, a Occupational psychologistveces, Chief Technology Officerel dolor de Teachers Insurance and Annuity Associationestmago puede ser grave. El mdico intentar descubrir la causa del  dolor de Teaching laboratory technician. Siga estas indicaciones en su casa:  Tome los medicamentos de venta libre y los recetados solamente como se lo haya indicado el mdico. No tome medicamentos que lo ayuden a Advertising copywriter (laxantes), salvo que el mdico se lo indique.  Beba suficiente lquido para mantener el pis (orina) claro o de color amarillo plido.  Est atento al dolor de estmago para Insurance risk surveyor cambio.  Concurra a todas las visitas de control como se lo haya indicado el mdico. Esto es importante. Comunquese con un mdico si:  El dolor de [address] cambia o [place].  No tiene apetito o baja de peso sin proponrselo.  Tiene dificultades para defecar (est estreido) o heces lquidas (diarrea) durante ms de 2 o 3das.  Siente dolor al orinar o defecar.  El dolor de estmago lo despierta de noche.  El dolor empeora con las comidas, despus de comer o con determinados alimentos.  Vomita y no puede retener nada de lo que ingiere.  Tiene fiebre. Solicite ayuda de inmediato si:  El dolor no desaparece en el tiempo indicado por el mdico.  No puede detener los vmitos.  Siente dolor solamente en zonas especficas del abdomen, como el lado derecho o la parte inferior izquierda.  Tiene heces con sangre, de color negro o con aspecto alquitranado.  Tiene dolor muy intenso en el vientre, clicos o meteorismo.  Presenta signos de no tener suficientes lquidos o agua en el cuerpo  (deshidratacin), por ejemplo: ? [place], es muy escasa o no orina. ? Labios agrietados. ? [employer]. ? Ojos hundidos. ? Somnolencia. ? Debilidad. Esta informacin no tiene Theme park manager el consejo del mdico. Asegrese de hacerle al mdico cualquier pregunta que tenga. Document Released: 10/14/2008 Document Revised: 10/12/2016 Document Reviewed: 12/30/2015 Elsevier Interactive Patient Education  2018 Elsevier Inc.      Edwina Barth, MD [facility]

## 2017-07-31 ENCOUNTER — Other Ambulatory Visit: Payer: Self-pay

## 2017-07-31 ENCOUNTER — Encounter (HOSPITAL_COMMUNITY): Payer: Self-pay

## 2017-07-31 ENCOUNTER — Emergency Department (HOSPITAL_COMMUNITY)
Admission: EM | Admit: 2017-07-31 | Discharge: 2017-07-31 | Disposition: A | Discharge status: Home / Self Care | Payer: BLUE CROSS/BLUE SHIELD | Attending: Emergency Medicine | Admitting: Emergency Medicine

## 2017-07-31 ENCOUNTER — Emergency Department (HOSPITAL_COMMUNITY): Payer: BLUE CROSS/BLUE SHIELD

## 2017-07-31 DIAGNOSIS — R109 Unspecified abdominal pain: Secondary | ICD-10-CM

## 2017-07-31 DIAGNOSIS — I10 Essential (primary) hypertension: Secondary | ICD-10-CM | POA: Insufficient documentation

## 2017-07-31 LAB — URINALYSIS, ROUTINE W REFLEX MICROSCOPIC
Bilirubin Urine: NEGATIVE
GLUCOSE, UA: NEGATIVE mg/dL
Hgb urine dipstick: NEGATIVE
Ketones, ur: NEGATIVE mg/dL
NITRITE: NEGATIVE
PH: 6 (ref 5.0–8.0)
Protein, ur: NEGATIVE mg/dL
Specific Gravity, Urine: 1.02 (ref 1.005–1.030)

## 2017-07-31 LAB — URINALYSIS, MICROSCOPIC (REFLEX): RBC / HPF: NONE SEEN RBC/hpf (ref 0–5)

## 2017-07-31 MED ORDER — TRAMADOL HCL 50 MG PO TABS: 50.0000 mg | ORAL_TABLET | Freq: Four times a day (QID) | ORAL | 0 refills | Status: DC | PRN

## 2017-07-31 MED ORDER — NAPROXEN 500 MG PO TABS: 500.0000 mg | ORAL_TABLET | Freq: Two times a day (BID) | ORAL | 0 refills | Status: DC

## 2017-07-31 NOTE — ED Notes (Signed)
ED Provider at bedside. 

## 2017-07-31 NOTE — ED Notes (Signed)
Bed: WA04 Expected date:  Expected time:  Means of arrival:  Comments: 

## 2017-07-31 NOTE — ED Notes (Signed)
Patient transported to CT 

## 2017-07-31 NOTE — ED Provider Notes (Signed)
[facility]-EMERGENCY DEPT Provider Note   CSN: [id number] Arrival date & time: 07/31/17  0119     History   Chief Complaint Chief Complaint  Patient presents with  . Flank Pain    Right    HPI Patricia Gross is a 51 y.o. female.  Patient is a 51 year old female presenting with complaints of right flank pain that started yesterday. She reports pain with movement and lying back. She denies any specific injury or trauma. She denies any urinary complaints. She denies any radiation of her pain into her legs and is having no weakness, numbness, or tingling.   The history is provided by the patient.  Flank Pain  This is a new problem. The current episode started 2 days ago. The problem occurs constantly. The problem has been gradually worsening. Exacerbated by: Movement and palpation. Nothing relieves the symptoms. She has tried nothing for the symptoms.    Past Medical History:  Diagnosis Date  . Arthritis   . Asthma   . Hypertension     Patient Active Problem List   Diagnosis Date Noted  . Left lower quadrant pain 07/29/2017  . Hematuria 07/29/2017  . Fatty liver 07/16/2016  . Benign essential HTN 07/16/2016    Past Surgical History:  Procedure Laterality Date  . GALLBLADDER SURGERY      OB History    No data available       Home Medications    Prior to Admission medications   Medication Sig Start Date End Date Taking? Authorizing Provider  atorvastatin (LIPITOR) 40 MG tablet Take 1 tablet (40 mg total) daily by mouth. 06/19/17   McVey, Madelaine BhatElizabeth Whitney, PA-C  metoprolol tartrate (LOPRESSOR) 50 MG tablet Take 1 tablet (50 mg total) 2 (two) times daily by mouth. 06/16/17   McVey, Madelaine BhatElizabeth Whitney, PA-C    Family History Family History  Problem Relation Age of Onset  . Aneurysm Mother   . Alcohol abuse Father     Social History Social History   Tobacco Use  . Smoking status: Never Smoker  . Smokeless tobacco: Never Used    Substance Use Topics  . Alcohol use: No  . Drug use: No     Allergies   Patient has no known allergies.   Review of Systems Review of Systems  Genitourinary: Positive for flank pain.  All other systems reviewed and are negative.    Physical Exam Updated Vital Signs BP 124/73 (BP Location: Left Arm)   Pulse 77   Temp 98 F (36.7 C) (Oral)   Resp 18   Ht 5\' 6"  (1.676 m)   Wt 102.1 kg (225 lb)   SpO2 100%   BMI 36.32 kg/m   Physical Exam  Constitutional: She is oriented to person, place, and time. She appears well-developed and well-nourished. No distress.  HENT:  Head: Normocephalic and atraumatic.  Neck: Normal range of motion. Neck supple.  Cardiovascular: Normal rate and regular rhythm. Exam reveals no gallop and no friction rub.  No murmur heard. Pulmonary/Chest: Effort normal and breath sounds normal. No respiratory distress. She has no wheezes.  Abdominal: Soft. Bowel sounds are normal. She exhibits no distension. There is no tenderness.  There is tenderness to palpation in the right flank. Abdomen is benign.  Musculoskeletal: Normal range of motion.  Neurological: She is alert and oriented to person, place, and time.  Skin: Skin is warm and dry. She is not diaphoretic.  Nursing note and vitals reviewed.    ED Treatments /  Results  Labs (all labs ordered are listed, but only abnormal results are displayed) Labs Reviewed  URINALYSIS, ROUTINE W REFLEX MICROSCOPIC - Abnormal; Notable for the following components:      Result Value   APPearance HAZY (*)    Leukocytes, UA TRACE (*)    All other components within normal limits  URINALYSIS, MICROSCOPIC (REFLEX) - Abnormal; Notable for the following components:   Bacteria, UA RARE (*)    Squamous Epithelial / LPF 6-30 (*)    All other components within normal limits    EKG  EKG Interpretation None       Radiology No results found.  Procedures Procedures (including critical care  time)  Medications Ordered in ED Medications - No data to display   Initial Impression / Assessment and Plan / ED Course  I have reviewed the triage vital signs and the nursing notes.  Pertinent labs & imaging results that were available during my care of the patient were reviewed by me and considered in my medical decision making (see chart for details).  Patient presents with right flank pain. Her presentation is consistent with a kidney stone, however nothing was identified. The CT scan fails to reveal any alternate pathology. Her urine is clear. I suspect a musculoskeletal etiology given the negative workup. She will be treated with anti-inflammatories, pain medicine, and is to follow-up as needed if not improving.  Final Clinical Impressions(s) / ED Diagnoses   Final diagnoses:  None    ED Discharge Orders    None       Geoffery Lyonselo, [place]anga Alvelo, MD 07/31/17 [phone]

## 2017-07-31 NOTE — ED Triage Notes (Signed)
Pt reports right sided flank pain w/o radiation. Pt seen by PCP yesterday who stated she will need a follow-up CT due to UA results there showing blood in her urine. Pt denies heavy lifting. Pt denies fall or injury. Pt denies urinary s/s. Pt A+OX4.

## 2017-07-31 NOTE — Discharge Instructions (Signed)
Naproxen as prescribed.  Tramadol as prescribed as needed for pain not relieved with naproxen.  Follow-up with your primary Dr. if you're not improving in the next week, and return to the ER his symptoms significantly worsen or change.

## 2017-08-02 ENCOUNTER — Ambulatory Visit: Payer: BLUE CROSS/BLUE SHIELD | Admitting: Emergency Medicine

## 2017-08-02 ENCOUNTER — Encounter: Payer: Self-pay | Admitting: Emergency Medicine

## 2017-08-02 ENCOUNTER — Other Ambulatory Visit: Payer: Self-pay

## 2017-08-02 VITALS — BP 124/70 | HR 84 | Temp 98.7°F | Resp 18 | Ht 66.0 in | Wt 225.8 lb

## 2017-08-02 DIAGNOSIS — M545 Low back pain, unspecified: Secondary | ICD-10-CM | POA: Insufficient documentation

## 2017-08-02 DIAGNOSIS — S39012A Strain of muscle, fascia and tendon of lower back, initial encounter: Secondary | ICD-10-CM | POA: Insufficient documentation

## 2017-08-02 DIAGNOSIS — M7918 Myalgia, other site: Secondary | ICD-10-CM

## 2017-08-02 HISTORY — DX: Strain of muscle, fascia and tendon of lower back, initial encounter: S39.012A

## 2017-08-02 MED ORDER — IBUPROFEN 600 MG PO TABS: 600.0000 mg | ORAL_TABLET | Freq: Three times a day (TID) | ORAL | 1 refills | Status: DC | PRN

## 2017-08-02 MED ORDER — METHOCARBAMOL 500 MG PO TABS: 500.0000 mg | ORAL_TABLET | Freq: Three times a day (TID) | ORAL | 1 refills | Status: AC

## 2017-08-02 NOTE — Progress Notes (Signed)
Patricia Gross [age] y.o.   Chief Complaint  Patient presents with  . Back Pain    started hurting on christmas lower back pain     HISTORY OF PRESENT ILLNESS: This is a 52 y.o. female complaining of right lumbar pain x 1-2 weeks; seen in ED 12/31 x same; had normal CT scan A/P; prescribed Naproxen and Tramadol but not helping much. No associated symptoms.  HPI   Prior to Admission medications   Medication Sig Start Date End Date Taking? Authorizing Provider  metoprolol tartrate (LOPRESSOR) 50 MG tablet Take 1 tablet (50 mg total) 2 (two) times daily by mouth. 06/16/17  Yes McVey, Madelaine BhatElizabeth Whitney, PA-C  atorvastatin (LIPITOR) 40 MG tablet Take 1 tablet (40 mg total) daily by mouth. Patient not taking: Reported on 07/31/2017 06/19/17   McVey, Madelaine BhatElizabeth Whitney, PA-C  naproxen (NAPROSYN) 500 MG tablet Take 1 tablet (500 mg total) by mouth 2 (two) times daily with a meal. Patient not taking: Reported on 08/02/2017 07/31/17   Patricia Gross, Douglas, MD  traMADol (ULTRAM) 50 MG tablet Take 1 tablet (50 mg total) by mouth every 6 (six) hours as needed. Patient not taking: Reported on 08/02/2017 07/31/17   Patricia Gross, Douglas, MD    No Known Allergies  Patient Active Problem List   Diagnosis Date Noted  . Left lower quadrant pain 07/29/2017  . Hematuria 07/29/2017  . Fatty liver 07/16/2016  . Benign essential HTN 07/16/2016    Past Medical History:  Diagnosis Date  . Arthritis   . Asthma   . Hypertension     Past Surgical History:  Procedure Laterality Date  . GALLBLADDER SURGERY      Social History   Socioeconomic History  . Marital status: Single    Spouse name: Not on file  . Number of children: 4  . Years of education: Not on file  . Highest education level: Not on file  Social Needs  . Financial resource strain: Not on file  . Food insecurity - worry: Not on file  . Food insecurity - inability: Not on file  . Transportation needs - medical: Not on file  . Transportation needs -  non-medical: Not on file  Occupational History  . Occupation: cuts vegetables  Tobacco Use  . Smoking status: Never Smoker  . Smokeless tobacco: Never Used  Substance and Sexual Activity  . Alcohol use: No  . Drug use: No  . Sexual activity: Not on file  Other Topics Concern  . Not on file  Social History Narrative   Originally from [place].   Came to the [place] in 2002.    Family History  Problem Relation Age of Onset  . Aneurysm Mother   . Alcohol abuse Father      Review of Systems  Constitutional: Negative.  Negative for chills and fever.  HENT: Negative.   Eyes: Negative.   Respiratory: Negative for cough and shortness of breath.   Cardiovascular: Negative.  Negative for chest pain and palpitations.  Gastrointestinal: Negative for abdominal pain, diarrhea, nausea and vomiting.  Genitourinary: Negative for dysuria, flank pain and hematuria.  Musculoskeletal: Positive for back pain. Negative for neck pain.  Skin: Negative.  Negative for rash.  Neurological: Negative.  Negative for dizziness and headaches.  Endo/Heme/Allergies: Negative.   All other systems reviewed and are negative.     Vitals:   08/02/17 1727  BP: 124/70  Pulse: 84  Resp: 18  Temp: 98.7 F (37.1 C)  SpO2: 95%    Physical  Exam  Constitutional: She is oriented to person, place, and time. She appears well-developed and well-nourished.  HENT:  Head: Normocephalic and atraumatic.  Mouth/Throat: Oropharynx is clear and moist.  Eyes: Conjunctivae and EOM are normal. Pupils are equal, round, and reactive to light.  Neck: Normal range of motion. Neck supple.  Cardiovascular: Normal rate and regular rhythm.  Pulmonary/Chest: Effort normal and breath sounds normal.  Abdominal: Soft. Bowel sounds are normal. She exhibits no distension. There is no tenderness.  Musculoskeletal:       Lumbar back: She exhibits decreased range of motion, tenderness and spasm. She exhibits no bony tenderness and normal  pulse.       Back:  Neurological: She is alert and oriented to person, place, and time. No sensory deficit. She exhibits normal muscle tone. Coordination normal.  Skin: Skin is warm and dry. Capillary refill takes less than 2 seconds. No rash noted.  Psychiatric: She has a normal mood and affect. Her behavior is normal.  Vitals reviewed.    ASSESSMENT & PLAN: [name] was seen today for back pain.  Diagnoses and all orders for this visit:  Lumbar pain -     methocarbamol (ROBAXIN) 500 MG tablet; Take 1 tablet (500 mg total) by mouth 3 (three) times daily for 10 days. -     ibuprofen (ADVIL,MOTRIN) 600 MG tablet; Take 1 tablet (600 mg total) by mouth every 8 (eight) hours as needed.  Acute myofascial strain of lumbar region, initial encounter -     methocarbamol (ROBAXIN) 500 MG tablet; Take 1 tablet (500 mg total) by mouth 3 (three) times daily for 10 days. -     ibuprofen (ADVIL,MOTRIN) 600 MG tablet; Take 1 tablet (600 mg total) by mouth every 8 (eight) hours as needed.  Musculoskeletal pain -     methocarbamol (ROBAXIN) 500 MG tablet; Take 1 tablet (500 mg total) by mouth 3 (three) times daily for 10 days. -     ibuprofen (ADVIL,MOTRIN) 600 MG tablet; Take 1 tablet (600 mg total) by mouth every 8 (eight) hours as needed.    Patient Instructions       IF you received an x-ray today, you will receive an invoice from [facility]. Please contact [facility] at [phone] with questions or concerns regarding your invoice.   IF you received labwork today, you will receive an invoice from [facility]. Please contact [facility] at [phone] with questions or concerns regarding your invoice.   Our billing staff will not be able to assist you with questions regarding bills from these companies.  You will be contacted with the lab results as soon as they are available. The fastest way to get your results is to activate your My Chart account. Instructions are located on  the last page of this paperwork. If you have not heard from [place] regarding the results in 2 weeks, please contact this office.     Dolor de espalda en adultos (Back Pain, Adult) El dolor de espalda es muy frecuente. A menudo mejora con el tiempo. La causa del dolor de espalda generalmente no es peligrosa. La [employer] de las personas puede aprender a Runner, broadcasting/film/video de espalda por s mismas. CUIDADOS EN EL HOGAR Controle su dolor de espalda a fin de Public house manager cambio. Las siguientes indicaciones ayudarn a Psychologist, clinical que pueda sentir:  Materials engineer. Comience con caminatas cortas sobre superficies planas si es posible. Trate de caminar un poco ms cada da.  Haga ejercicios con regularidad tal  como le indic el mdico. El ejercicio ayuda a que su espalda se cure ms rpidamente. Tambin ayuda a prevenir futuras lesiones al [employer] fuertes y flexibles.  No se siente, conduzca ni permanezca de pie durante ms de 30 minutos.  No permanezca en la cama. Si hace reposo ms de 1 a 2 das, puede demorar su recuperacin.  Sea cuidadoso al inclinarse o levantar un objeto. Use una tcnica apropiada para levantar peso: ? Flexione las rodillas. ? Mantenga el objeto cerca del cuerpo. ? No gire.  Duerma sobre un [employer]. Recustese sobre un costado y flexione las rodillas. Si se recuesta [employer], coloque una almohada debajo de las rodillas.  Tome los medicamentos solamente como se lo haya indicado el mdico.  Aplique hielo sobre la zona lesionada. ? Ponga el hielo en una bolsa plstica. ? Coloque una [employer] piel y la bolsa de hielo. ? Deje el hielo durante , 2 a 3veces por da, durante los primeros 2 o 3das. Despus de eso, puede alternar entre compresas de hielo y Airline pilot.  Evite sentir ansiedad o estrs. Encuentre maneras efectivas de lidiar con el estrs, Surveyor, mining ejercicio.  Mantenga un peso saludable. El peso excesivo ejerce  tensin sobre la espalda. SOLICITE AYUDA SI:  Siente dolor que no se alivia con reposo o medicamentos.  Siente cada vez ms dolor que se extiende a las piernas o los glteos.  El dolor no mejora en una semana.  Siente dolor por la noche.  Pierde peso.  Siente escalofros o fiebre. SOLICITE AYUDA DE INMEDIATO SI:  No puede controlar su materia fecal (heces) o el pis (orina).  Siente debilidad en las piernas o los brazos.  Siente prdida de la sensibilidad (adormecimiento) en las piernas o los brazos.  Tiene malestar estomacal (nuseas) o vomita.  Siente dolor de estmago (abdominal).  Siente que se desvanece (se desmaya). Esta informacin no tiene Theme park manager el consejo del mdico. Asegrese de hacerle al mdico cualquier pregunta que tenga. Document Released: 01/31/2011 Document Revised: 08/08/2014 Document Reviewed: 11/19/2013 Elsevier Interactive Patient Education  2018 Elsevier Inc.      Edwina Barth, MD [facility]

## 2017-08-02 NOTE — Patient Instructions (Addendum)
IF you received an x-ray today, you will receive an invoice from [facility]. Please contact [facility] at [phone] with questions or concerns regarding your invoice.   IF you received labwork today, you will receive an invoice from [facility]. Please contact [facility] at [phone] with questions or concerns regarding your invoice.   Our billing staff will not be able to assist you with questions regarding bills from these companies.  You will be contacted with the lab results as soon as they are available. The fastest way to get your results is to activate your My Chart account. Instructions are located on the last page of this paperwork. If you have not heard from us regarding the results in 2 weeks, please contact this office.     Dolor de espalda en adultos (Back Pain, Adult) El dolor de espalda es muy frecuente. A menudo mejora con el tiempo. La causa del dolor de espalda generalmente no es peligrosa. La mayora de las personas puede aprender a manejar el dolor de espalda por s mismas. CUIDADOS EN EL HOGAR Controle su dolor de espalda a fin de detectar algn cambio. Las siguientes indicaciones ayudarn a aliviar cualquier dolor que pueda sentir:  Mantngase activo. Comience con caminatas cortas sobre superficies planas si es posible. Trate de caminar un poco ms cada da.  Haga ejercicios con regularidad tal como le indic el mdico. El ejercicio ayuda a que su espalda se cure ms rpidamente. Tambin ayuda a prevenir futuras lesiones al mantener los msculos fuertes y flexibles.  No se siente, conduzca ni permanezca de pie durante ms de 30 minutos.  No permanezca en la cama. Si hace reposo ms de 1 a 2 das, puede demorar su recuperacin.  Sea cuidadoso al inclinarse o levantar un objeto. Use una tcnica apropiada para levantar peso: ? Flexione las rodillas. ? Mantenga el objeto cerca del cuerpo. ? No gire.  Duerma sobre un colchn firme. Recustese  sobre un costado y flexione las rodillas. Si se recuesta sobre la espalda, coloque una almohada debajo de las rodillas.  Tome los medicamentos solamente como se lo haya indicado el mdico.  Aplique hielo sobre la zona lesionada. ? Ponga el hielo en una bolsa plstica. ? Coloque una toalla entre la piel y la bolsa de hielo. ? Deje el hielo durante 20minutos, 2 a 3veces por da, durante los primeros 2 o 3das. Despus de eso, puede alternar entre compresas de hielo y calor.  Evite sentir ansiedad o estrs. Encuentre maneras efectivas de lidiar con el estrs, como hacer ejercicio.  Mantenga un peso saludable. El peso excesivo ejerce tensin sobre la espalda. SOLICITE AYUDA SI:  Siente dolor que no se alivia con reposo o medicamentos.  Siente cada vez ms dolor que se extiende a las piernas o los glteos.  El dolor no mejora en una semana.  Siente dolor por la noche.  Pierde peso.  Siente escalofros o fiebre. SOLICITE AYUDA DE INMEDIATO SI:  No puede controlar su materia fecal (heces) o el pis (orina).  Siente debilidad en las piernas o los brazos.  Siente prdida de la sensibilidad (adormecimiento) en las piernas o los brazos.  Tiene malestar estomacal (nuseas) o vomita.  Siente dolor de estmago (abdominal).  Siente que se desvanece (se desmaya). Esta informacin no tiene como fin reemplazar el consejo del mdico. Asegrese de hacerle al mdico cualquier pregunta que tenga. Document Released: 01/31/2011 Document Revised: 08/08/2014 Document Reviewed: 11/19/2013 Elsevier Interactive Patient Education  2018 Elsevier Inc.  

## 2017-08-03 ENCOUNTER — Telehealth: Payer: Self-pay | Admitting: Emergency Medicine

## 2017-08-03 NOTE — Telephone Encounter (Signed)
Pt has CT abdomen and pelvis ordered from 07/29/17. I was working on the [place] for this but Tyson Foodsthe insurance info we have on file for the pt is inactive. I left a vm for the pt to call us back with her updated insurance information so we can work on getting this authorized.

## 2017-09-01 ENCOUNTER — Inpatient Hospital Stay: Admission: RE | Admit: 2017-09-01 | Payer: BLUE CROSS/BLUE SHIELD | Source: Ambulatory Visit

## 2018-03-13 ENCOUNTER — Encounter: Payer: Self-pay | Admitting: Gastroenterology

## 2018-03-13 DIAGNOSIS — R400 Somnolence: Secondary | ICD-10-CM | POA: Insufficient documentation

## 2018-03-13 DIAGNOSIS — G4733 Obstructive sleep apnea (adult) (pediatric): Secondary | ICD-10-CM | POA: Insufficient documentation

## 2018-03-16 DIAGNOSIS — R739 Hyperglycemia, unspecified: Secondary | ICD-10-CM | POA: Insufficient documentation

## 2018-03-16 DIAGNOSIS — M1990 Unspecified osteoarthritis, unspecified site: Secondary | ICD-10-CM | POA: Insufficient documentation

## 2018-03-16 DIAGNOSIS — M25569 Pain in unspecified knee: Secondary | ICD-10-CM | POA: Insufficient documentation

## 2018-03-20 ENCOUNTER — Ambulatory Visit (INDEPENDENT_AMBULATORY_CARE_PROVIDER_SITE_OTHER): Payer: Self-pay | Admitting: Orthopaedic Surgery

## 2018-04-05 ENCOUNTER — Ambulatory Visit (INDEPENDENT_AMBULATORY_CARE_PROVIDER_SITE_OTHER): Payer: BLUE CROSS/BLUE SHIELD | Admitting: Physician Assistant

## 2018-04-05 ENCOUNTER — Ambulatory Visit (INDEPENDENT_AMBULATORY_CARE_PROVIDER_SITE_OTHER): Payer: Self-pay

## 2018-04-05 ENCOUNTER — Encounter (INDEPENDENT_AMBULATORY_CARE_PROVIDER_SITE_OTHER): Payer: Self-pay | Admitting: Physician Assistant

## 2018-04-05 DIAGNOSIS — G8929 Other chronic pain: Secondary | ICD-10-CM | POA: Diagnosis not present

## 2018-04-05 DIAGNOSIS — M25562 Pain in left knee: Secondary | ICD-10-CM | POA: Diagnosis not present

## 2018-04-05 NOTE — Progress Notes (Signed)
Office Visit Note   Patient: Patricia Gross           Date of Birth: 07/23/1966           MRN: [id number] Visit Date: 04/05/2018              Requested by: No referring provider defined for this encounter. PCP: Patient, No Pcp Per   Assessment & Plan: Visit Diagnoses:  1. Chronic pain of left knee     Plan: Discussed quad strengthening exercises with her.  We will send her to formal physical therapy to work on quad strengthening exercises. Have her follow-up with [place] in 1 month check her progress lack of.   Follow-Up Instructions: Return in about 4 weeks (around 05/03/2018).   Orders:  Orders Placed This Encounter  Procedures  . XR Knee 1-2 Views Left   No orders of the defined types were placed in this encounter.     Procedures: No procedures performed   Clinical Data: No additional findings.   Subjective: Chief Complaint  Patient presents with  . Left Knee - Pain    HPI Patricia Gross is a 52 year old female comes in today for left knee pain is been ongoing for long period of time.  She was last seen in 2016 and at that time was found to have a small effusion in the knee and findings consistent with an old lateral collateral ligament injury.  She is given a cortisone injection but did well for long time.  She comes in today stating that she is been "lazy" about coming to the doctor has had some pain in the knee for some period of time now.  She said no new injury.  She denies any mechanical symptoms of the knee.  She reports an old injury rollerskating as a young child and was told that she had a sprain of the left knee.  She notes some swelling of the knee at times.  She has pain when descending steps. Review of Systems See HPI.  Objective: Vital Signs: There were no vitals taken for this visit.  Physical Exam  Constitutional: She is oriented to person, place, and time. She appears well-developed and well-nourished. No distress.  Pulmonary/Chest: Effort normal.    Neurological: She is alert and oriented to person, place, and time.  Skin: She is not diaphoretic.  Psychiatric: She has a normal mood and affect.    Ortho Exam Bilateral knees good range of motion without pain.  She has tenderness along the medial joint line of the left knee.  Left knee with patellofemoral crepitus.  No instability valgus varus stressing.  Anterior drawer is negative bilaterally.  No effusion abnormal warmth erythema or ecchymosis of either knee.  McMurray's is negative bilaterally. Specialty Comments:  No specialty comments available.  Imaging: Xr Knee 1-2 Views Left  Result Date: 04/05/2018   AP and lateral view left knee: No acute fracture.  She has mild narrowing medial joint line and mild to moderate changes of patellofemoral joint.  Calcification in the area of the lateral collateral ligament consistent with old ligamentous injury.  Knee is well located.    PMFS History: Patient Active Problem List   Diagnosis Date Noted  . Lumbar pain 08/02/2017  . Acute lumbar myofascial strain 08/02/2017  . Musculoskeletal pain 08/02/2017  . Left lower quadrant pain 07/29/2017  . Hematuria 07/29/2017  . Fatty liver 07/16/2016  . Benign essential HTN 07/16/2016   Past Medical History:  Diagnosis Date  .  Arthritis   . Asthma   . Hypertension     Family History  Problem Relation Age of Onset  . Aneurysm Mother   . Alcohol abuse Father     Past Surgical History:  Procedure Laterality Date  . GALLBLADDER SURGERY     Social History   Occupational History  . Occupation: cuts vegetables  Tobacco Use  . Smoking status: Never Smoker  . Smokeless tobacco: Never Used  Substance and Sexual Activity  . Alcohol use: No  . Drug use: No  . Sexual activity: Not on file

## 2018-04-06 ENCOUNTER — Other Ambulatory Visit (INDEPENDENT_AMBULATORY_CARE_PROVIDER_SITE_OTHER): Payer: Self-pay

## 2018-04-06 DIAGNOSIS — M25562 Pain in left knee: Secondary | ICD-10-CM

## 2018-04-30 ENCOUNTER — Telehealth: Payer: Self-pay

## 2018-04-30 NOTE — Telephone Encounter (Signed)
Patient No Showed for PV. Patient was called to reschedule. No Answer.  I was not able to leave a message because voice mail has not been set up. If patient does not call to reschedule her appointment or if I am unable to contact the patient a no show letter will be mailed and colonoscopy cancelled per [facility] guidelines.   Janalee Dane, LPN ( PV )

## 2018-05-03 ENCOUNTER — Encounter: Payer: Self-pay | Admitting: Physician Assistant

## 2018-05-07 ENCOUNTER — Ambulatory Visit (INDEPENDENT_AMBULATORY_CARE_PROVIDER_SITE_OTHER): Payer: BLUE CROSS/BLUE SHIELD | Admitting: Physician Assistant

## 2018-05-14 ENCOUNTER — Encounter: Payer: Self-pay | Admitting: Gastroenterology

## 2018-06-12 DIAGNOSIS — M1990 Unspecified osteoarthritis, unspecified site: Secondary | ICD-10-CM | POA: Insufficient documentation

## 2019-05-11 ENCOUNTER — Encounter (HOSPITAL_COMMUNITY): Payer: Self-pay | Admitting: *Deleted

## 2019-05-11 ENCOUNTER — Other Ambulatory Visit: Payer: Self-pay

## 2019-05-11 ENCOUNTER — Emergency Department (HOSPITAL_COMMUNITY)
Admission: EM | Admit: 2019-05-11 | Discharge: 2019-05-11 | Disposition: A | Discharge status: Home / Self Care | Payer: BC Managed Care – PPO | Attending: Emergency Medicine | Admitting: Emergency Medicine

## 2019-05-11 ENCOUNTER — Emergency Department (HOSPITAL_COMMUNITY): Payer: BC Managed Care – PPO

## 2019-05-11 DIAGNOSIS — F4329 Adjustment disorder with other symptoms: Secondary | ICD-10-CM

## 2019-05-11 DIAGNOSIS — Z20828 Contact with and (suspected) exposure to other viral communicable diseases: Secondary | ICD-10-CM | POA: Diagnosis not present

## 2019-05-11 DIAGNOSIS — R0602 Shortness of breath: Secondary | ICD-10-CM

## 2019-05-11 DIAGNOSIS — I10 Essential (primary) hypertension: Secondary | ICD-10-CM | POA: Insufficient documentation

## 2019-05-11 DIAGNOSIS — Z79899 Other long term (current) drug therapy: Secondary | ICD-10-CM | POA: Insufficient documentation

## 2019-05-11 LAB — BASIC METABOLIC PANEL
Anion gap: 10 (ref 5–15)
BUN: 8 mg/dL (ref 6–20)
CO2: 26 mmol/L (ref 22–32)
Calcium: 9.3 mg/dL (ref 8.9–10.3)
Chloride: 104 mmol/L (ref 98–111)
Creatinine, Ser: 0.62 mg/dL (ref 0.44–1.00)
GFR calc Af Amer: >60 mL/min (ref >60)
GFR calc non Af Amer: >60 mL/min (ref >60)
Glucose, Bld: 100 mg/dL — ABNORMAL HIGH (ref 70–99)
Potassium: 3.9 mmol/L (ref 3.5–5.1)
Sodium: 140 mmol/L (ref 135–145)

## 2019-05-11 LAB — MAGNESIUM: Magnesium: 2.3 mg/dL (ref 1.7–2.4)

## 2019-05-11 LAB — CBC
HCT: 44.4 % (ref 36.0–46.0)
Hemoglobin: 14.2 g/dL (ref 12.0–15.0)
MCH: 28.5 pg (ref 26.0–34.0)
MCHC: 32 g/dL (ref 30.0–36.0)
MCV: 89.2 fL (ref 80.0–100.0)
Platelets: 212 10*3/uL (ref 150–400)
RBC: 4.98 MIL/uL (ref 3.87–5.11)
RDW: 13.1 % (ref 11.5–15.5)
WBC: 6.8 10*3/uL (ref 4.0–10.5)
nRBC: 0 % (ref 0.0–0.2)

## 2019-05-11 LAB — SARS CORONAVIRUS 2 BY RT PCR (HOSPITAL ORDER, PERFORMED IN ~~LOC~~ HOSPITAL LAB): SARS Coronavirus 2: NEGATIVE

## 2019-05-11 LAB — TROPONIN I (HIGH SENSITIVITY)
Troponin I (High Sensitivity): 3 ng/L (ref <18)
Troponin I (High Sensitivity): 3 ng/L (ref <18)

## 2019-05-11 MED ORDER — PREDNISONE 10 MG PO TABS: 40.0000 mg | ORAL_TABLET | Freq: Every day | ORAL | 0 refills | Status: AC

## 2019-05-11 MED ORDER — CYCLOBENZAPRINE HCL 10 MG PO TABS: 10.0000 mg | ORAL_TABLET | Freq: Three times a day (TID) | ORAL | 0 refills | Status: AC

## 2019-05-11 MED ORDER — SODIUM CHLORIDE 0.9% FLUSH: 3.0000 mL | Freq: Once | INTRAVENOUS | Status: DC

## 2019-05-11 MED ORDER — ALBUTEROL SULFATE HFA 108 (90 BASE) MCG/ACT IN AERS: 1.0000 | INHALATION_SPRAY | Freq: Four times a day (QID) | RESPIRATORY_TRACT | 0 refills | Status: DC | PRN

## 2019-05-11 MED ORDER — HYDROXYZINE HCL 25 MG PO TABS: 25.0000 mg | ORAL_TABLET | Freq: Four times a day (QID) | ORAL | 0 refills | Status: DC

## 2019-05-11 NOTE — ED Provider Notes (Addendum)
[facility] EMERGENCY DEPARTMENT Provider Note   CSN: [id number] Arrival date & time: 05/11/19  1725     History   Chief Complaint No chief complaint on file.   HPI Patricia Gross is a 53 y.o. female with history of hypertension, asthma, cholecystectomy presents to the ER for difficulty breathing for the last 3 days.  Describes it as air does not feel like it is going down all the way to the deeper parts of her lower lungs when breathing, taking deep breaths or yawning. Feels like the air is not moving all the way down.  This is actually painless and she has no pain in her chest, tightness, pleuritic chest pain.  Yesterday she had mild tongue and throat discomfort described as "dryness" but this has resolved.  States she snores and sleeps with her mouth open and thinks that maybe her throat just got dry and irritated.  Had mild wheezing yesterday and this morning. Currently she has no pain in her throat with swallowing.  Had some diarrhea a couple of days ago but this resolved.  Admits that she started taking natural slim "magic mag" antistress powder supplement 1 week ago.  She has taken 3 doses of this but thinks that she may have doubled the recommended dose each time.  Bottle says it contains magnesium 81 mg but she thinks she has taken double this 3 times total.  She thinks that is why she had diarrhea.  States that lately she has had increased "nerves".  She has not been sleeping well and feels more anxious and stressed at home.  Attributes all of this to recent stress at home including COVID-19 pandemic causing her to lose her job and having less money.  Her lease is up at the end of September.  Her 23 year old daughter now states she wants to move out to be "independent" and now this is stressing her out.  States that when she was in her late 19s she used to had "severe nerve issues" and anxiety.  She is from [place] and states she had to go to the emergency room several times  for feeling like she was "suffocating" and having muscle spasms.  States her shortness of breath today feels similar and admits that she is also having pains in her neck and trapezius.  She is wondering if she can have some medicine for her nerves here.  She used to be on Valium but does not want to be taking this because it makes her sleepy.  No exposure to COVID-19 but states she is out at the grocery store with a mask several times a week.  She denies any fever, congestion, cough, chest pain, palpitations, abdominal pain, nausea, vomiting, dysuria.  No lower extremity edema.  No calf pain.  No history of DVT or PE, hormone therapy, cancer history, hemoptysis, recent surgery or prolonged travel.     HPI  Past Medical History:  Diagnosis Date   Arthritis    Asthma    Hypertension     Patient Active Problem List   Diagnosis Date Noted   Lumbar pain 08/02/2017   Acute lumbar myofascial strain 08/02/2017   Musculoskeletal pain 08/02/2017   Left lower quadrant pain 07/29/2017   Hematuria 07/29/2017   Fatty liver 07/16/2016   Benign essential HTN 07/16/2016    Past Surgical History:  Procedure Laterality Date   GALLBLADDER SURGERY       OB History   No obstetric history on file.  Home Medications    Prior to Admission medications   Medication Sig Start Date End Date Taking? Authorizing Provider  albuterol (VENTOLIN HFA) 108 (90 Base) MCG/ACT inhaler Inhale 1-2 puffs into the lungs every 6 (six) hours as needed for wheezing or shortness of breath. 05/11/19   Liberty HandyGibbons, Jenavie Stanczak J, PA-C  atorvastatin (LIPITOR) 40 MG tablet Take 1 tablet (40 mg total) daily by mouth. Patient not taking: Reported on 07/31/2017 06/19/17   McVey, Madelaine BhatElizabeth Whitney, PA-C  cyclobenzaprine (FLEXERIL) 10 MG tablet Take 1 tablet (10 mg total) by mouth 3 (three) times daily for 7 days. 05/11/19 05/18/19  Liberty HandyGibbons, Lemond Griffee J, PA-C  hydrOXYzine (ATARAX/VISTARIL) 25 MG tablet Take 1 tablet (25 mg  total) by mouth every 6 (six) hours. 05/11/19   Liberty HandyGibbons, Cristina Ceniceros J, PA-C  ibuprofen (ADVIL,MOTRIN) 600 MG tablet Take 1 tablet (600 mg total) by mouth every 8 (eight) hours as needed. 08/02/17   Georgina QuintSagardia, Miguel Jose, MD  metoprolol tartrate (LOPRESSOR) 50 MG tablet Take 1 tablet (50 mg total) 2 (two) times daily by mouth. 06/16/17   McVey, Madelaine BhatElizabeth Whitney, PA-C  naproxen (NAPROSYN) 500 MG tablet Take 1 tablet (500 mg total) by mouth 2 (two) times daily with a meal. Patient not taking: Reported on 08/02/2017 07/31/17   Geoffery Lyonselo, Douglas, MD  predniSONE (DELTASONE) 10 MG tablet Take 4 tablets (40 mg total) by mouth daily for 5 days. 05/11/19 05/16/19  Liberty HandyGibbons, Tikia Skilton J, PA-C  traMADol (ULTRAM) 50 MG tablet Take 1 tablet (50 mg total) by mouth every 6 (six) hours as needed. Patient not taking: Reported on 08/02/2017 07/31/17   Geoffery Lyonselo, Douglas, MD    Family History Family History  Problem Relation Age of Onset   Aneurysm Mother    Alcohol abuse Father     Social History Social History   Tobacco Use   Smoking status: Never Smoker   Smokeless tobacco: Never Used  Substance Use Topics   Alcohol use: No   Drug use: No     Allergies   Patient has no known allergies.   Review of Systems Review of Systems  HENT: Positive for sore throat (resolved).   Respiratory: Positive for shortness of breath.   Gastrointestinal: Positive for diarrhea (resolved).  Psychiatric/Behavioral: Positive for sleep disturbance. The patient is nervous/anxious.   All other systems reviewed and are negative.    Physical Exam Updated Vital Signs BP (!) 160/84 (BP Location: Left Arm)    Pulse 74    Temp 98.3 F (36.8 C) (Oral)    Resp 20    Ht 5\' 5"  (1.651 m)    Wt 104.3 kg    SpO2 100%    BMI 38.27 kg/m   Physical Exam Vitals signs and nursing note reviewed.  Constitutional:      Appearance: She is well-developed.     Comments: Non toxic in NAD  HENT:     Head: Normocephalic and atraumatic.     Nose:  Nose normal.  Eyes:     Conjunctiva/sclera: Conjunctivae normal.  Neck:     Musculoskeletal: Normal range of motion. Muscular tenderness present.     Comments: Mild right trapezius tenderness.  No midline C-spine tenderness.  No meningismus.  Forward motion of the neck without pain. Cardiovascular:     Rate and Rhythm: Normal rate and regular rhythm.     Comments: No lower extremity edema.  No calf tenderness. Pulmonary:     Effort: Pulmonary effort is normal.     Comments: Normal work of  breathing.  No wheezing, crackles.  Diminished lung entry to the lower lobes anteriorly and posteriorly, difficult exam due to body habitus.   Abdominal:     General: Bowel sounds are normal.     Palpations: Abdomen is soft.     Tenderness: There is no abdominal tenderness.     Comments: No G/R/R. No suprapubic or CVA tenderness. Negative Murphy's and McBurney's. Active BS to lower quadrants.   Musculoskeletal: Normal range of motion.  Skin:    General: Skin is warm and dry.     Capillary Refill: Capillary refill takes less than 2 seconds.  Neurological:     Mental Status: She is alert.  Psychiatric:        Mood and Affect: Mood is anxious.        Behavior: Behavior normal.      ED Treatments / Results  Labs (all labs ordered are listed, but only abnormal results are displayed) Labs Reviewed  BASIC METABOLIC PANEL - Abnormal; Notable for the following components:      Result Value   Glucose, Bld 100 (*)    All other components within normal limits  SARS CORONAVIRUS 2 BY RT PCR (HOSPITAL ORDER, PERFORMED IN [facility])  CBC  MAGNESIUM  TROPONIN I (HIGH SENSITIVITY)  TROPONIN I (HIGH SENSITIVITY)    EKG None  Radiology Dg Chest 2 View  Result Date: 05/11/2019 CLINICAL DATA:  Pt states she has been having SOB and a tightness in her chest for 2-3 days now. No audible wheezing, hx asthma. EXAM: CHEST - 2 VIEW COMPARISON:  None. FINDINGS: The heart size and mediastinal  contours are within normal limits. Diffuse bilateral increased interstitial markings. No focal pulmonary opacity. No pneumothorax or pleural effusion. Multilevel degenerative changes in the thoracic spine. IMPRESSION: Diffuse bilateral increased interstitial markings could represent atypical/viral infection or reactive airways disease. No focal infiltrate. Electronically Signed   By: Emmaline Kluver M.D.   On: 05/11/2019 18:47    Procedures Procedures (including critical care time)  Medications Ordered in ED Medications  sodium chloride flush (NS) 0.9 % injection 3 mL (has no administration in time range)     Initial Impression / Assessment and Plan / ED Course  I have reviewed the triage vital signs and the nursing notes.  Pertinent labs & imaging results that were available during my care of the patient were reviewed by me and considered in my medical decision making (see chart for details).  Clinical Course as of May 10 2156  Sat May 11, 2019  1826 Micah Flesher to see patient, in x-ray    [CG]  1945 Diffuse bilateral increased interstitial markings could represent atypical/viral infection or reactive airways disease. No focal infiltrate.  DG Chest 2 View [CG]    Clinical Course User Index [CG] Liberty Handy, PA-C  EMR reviewed.  Shortness of breath described as feeling like she does not get enough air down to the bottom of her lungs.  Had some wheezing, sore irritation and diarrhea but these have all resolved.  Admits previous history of anxiety when she was in her 38s that caused her similar shortness of breath and admits lately she has been sleeping poorly, felt more anxious and stressed due to several such economic factors at home.  No fever, cough, exertional or pleuritic chest pain, positional chest pain.  No risk factors for PE or DVT.  Reports her older daughter had COVID-19 a while ago but denies any other exposures recently.  Exam  is benign without fever, tachycardia,  tachypnea or hypoxemia.  No wheezing.  She ambulated in the ER with normal saturations.  ER work-up reviewed by me is vastly reassuring.  No leukocytosis.  Hgb is normal.  She is worried about her magnesium and potassium levels because she recently started taking over-the-counter "stress" supplement including 81 mg of magnesium that she has been taking.  Her electrolytes are normal.  EKG is without ischemia.  Troponin obtained at triage is 3.  She has no chest pain and I do not think she needs a repeat troponin. I considered PE highly unlikely as h/o work up exam not consistent with this.   Chest x-ray shows increased interstitial markings possibly atypical viral infection or reactive airway disease.  Discussed x-ray findings with patient and daughter on the phone, they were both very concerned and requested passes COVID-19 test.  Patient states she is very stressed out about having COVID-19 and states she will not be able to sleep until her results are back.  Given symptoms of shortness of breath, wheezing, sore irritation, diarrhea, x-ray findings and patient concern rapid test was obtained here.  2155: COVID negative. Recommended symptomatic management of viral URI, early mild asthma exacerbation.  I discussed this plan with patient and symptomatic management at home.  We will DC her with prednisone for mild asthma exacerbation/reactive airway disease.  Recommended close follow-up with PCP, hydroxizine, melatonin for increased stress/insomnia at home. Final Clinical Impressions(s) / ED Diagnoses   Final diagnoses:  Shortness of breath  Stress and adjustment reaction    ED Discharge Orders         Ordered    predniSONE (DELTASONE) 10 MG tablet  Daily     05/11/19 2129    hydrOXYzine (ATARAX/VISTARIL) 25 MG tablet  Every 6 hours     05/11/19 2129    cyclobenzaprine (FLEXERIL) 10 MG tablet  3 times daily     05/11/19 2129    albuterol (VENTOLIN HFA) 108 (90 Base) MCG/ACT inhaler  Every 6  hours PRN     05/11/19 2129           Arlean Hopping 05/11/19 2129    Veryl Speak, MD 05/11/19 2147    Kinnie Feil, PA-C 05/11/19 2157    Veryl Speak, MD 05/12/19 1059

## 2019-05-11 NOTE — ED Triage Notes (Signed)
The pt has asthma no audible wheezes no visible sob  No acute distress  Symptoms for 2-3 days  No temp

## 2019-05-11 NOTE — ED Notes (Signed)
Pt ambulated around room, pulse ox never dropped below 99% on RA

## 2019-05-11 NOTE — Discharge Instructions (Addendum)
You were seen in the ER for shortness of breath.    Your COVID-19 test was negative.  I suspect you have another viral upper respiratory infection possibly leading to mild early asthma flare.  Treatment of your illness and symptoms will include self-isolation, monitoring of symptoms and supportive care with over-the-counter medicines.    Take prednisone as prescribed, use albuterol inhaler every 4 hours.    Return to the ED if there is increased work of breathing, shortness of breath, inability to tolerate fluids, weakness, chest pain.  If COVID test is NEGATIVE, you may return to work and essential activities as long as your symptoms have improved and you do not have a fever for a total of 3 days.  Call your job and notify them that your test result was negative to see if they will allow you to return to work.   Stay well-hydrated. Rest. You can use over the counter medications to help with symptoms: 600 mg ibuprofen (motrin, aleve, advil) or acetaminophen (tylenol) every 6 hours, around the clock to help with associated fevers, sore throat, headaches, generalized body aches and malaise.  Oxymetazoline (afrin) intranasal spray once daily for no more than 3 days to help with congestion, after 3 days you can switch to another over-the-counter nasal steroid spray such as fluticasone (flonase) Allergy medication (loratadine, cetirizine, etc) and phenylephrine (sudafed) help with nasal congestion, runny nose and postnasal drip.   Dextromethorphan (Delsym) to suppress dry cough. Frequent coughing is likely causing your chest wall pain Wash your hands often to prevent spread.    Infection Prevention Recommendations for Individuals Confirmed to have, or Being Evaluated for, or have symptoms of 2019 Novel Coronavirus (COVID-19) Infection Who Receive Care at Home  Individuals who are confirmed to have, or are being evaluated for, COVID-19 should follow the prevention steps below until a healthcare  provider or local or state health department says they can return to normal activities.  Stay home except to get medical care You should restrict activities outside your home, except for getting medical care. Do not go to work, school, or public areas, and do not use public transportation or taxis.  Call ahead before visiting your doctor Before your medical appointment, call the healthcare provider and tell them that you have, or are being evaluated for, COVID-19 infection. This will help the healthcare providers office take steps to keep other people from getting infected. Ask your healthcare provider to call the local or state health department.  Monitor your symptoms Seek prompt medical attention if your illness is worsening (e.g., difficulty breathing). Before going to your medical appointment, call the healthcare provider and tell them that you have, or are being evaluated for, COVID-19 infection. Ask your healthcare provider to call the local or state health department.  Wear a facemask You should wear a facemask that covers your nose and mouth when you are in the same room with other people and when you visit a healthcare provider. People who live with or visit you should also wear a facemask while they are in the same room with you.  Separate yourself from other people in your home As much as possible, you should stay in a different room from other people in your home. Also, you should use a separate bathroom, if available.  Avoid sharing household items You should not share dishes, drinking glasses, cups, eating utensils, towels, bedding, or other items with other people in your home. After using these items, you should wash them thoroughly  with soap and water.  Cover your coughs and sneezes Cover your mouth and nose with a tissue when you cough or sneeze, or you can cough or sneeze into your sleeve. Throw used tissues in a lined trash can, and immediately wash your hands with soap  and water for at least 20 seconds or use an alcohol-based hand rub.  Wash your [employer] Pacific Corporationhands Wash your hands often and thoroughly with soap and water for at least 20 seconds. You can use an alcohol-based hand sanitizer if soap and water are not available and if your hands are not visibly dirty. Avoid touching your eyes, nose, and mouth with unwashed hands.   Prevention Steps for Caregivers and Household Members of Individuals Confirmed to have, or Being Evaluated for, or have symptoms of 2019 Novel Coronavirus (COVID-19) Infection Being Cared for in the Home  If you live with, or provide care at home for, a person confirmed to have, or being evaluated for, COVID-19 infection please follow these guidelines to prevent infection:  Follow healthcare providers instructions Make sure that you understand and can help the patient follow any healthcare provider instructions for all care.  Provide for the patients basic needs You should help the patient with basic needs in the home and provide support for getting groceries, prescriptions, and other personal needs.  Monitor the patients symptoms If they are getting sicker, call his or her medical provider and tell them that the patient has, or is being evaluated for, COVID-19 infection. This will help the healthcare providers office take steps to keep other people from getting infected. Ask the healthcare provider to call the local or state health department.  Limit the number of people who have contact with the patient If possible, have only one caregiver for the patient. Other household members should stay in another home or place of residence. If this is not possible, they should stay in another room, or be separated from the patient as much as possible. Use a separate bathroom, if available. Restrict visitors who do not have an essential need to be in the home.  Keep older adults, very young children, and other sick people away from the patient Keep  older adults, very young children, and those who have compromised immune systems or chronic health conditions away from the patient. This includes people with chronic heart, lung, or kidney conditions, diabetes, and cancer.  Ensure good ventilation Make sure that shared spaces in the home have good air flow, such as from an air conditioner or an opened window, weather permitting.  Wash your hands often Wash your hands often and thoroughly with soap and water for at least 20 seconds. You can use an alcohol based hand sanitizer if soap and water are not available and if your hands are not visibly dirty. Avoid touching your eyes, nose, and mouth with unwashed hands. Use disposable paper towels to dry your hands. If not available, use dedicated cloth towels and replace them when they become wet.  Wear a facemask and gloves Wear a disposable facemask at all times in the room and gloves when you touch or have contact with the patients blood, body fluids, and/or secretions or excretions, such as sweat, saliva, sputum, nasal mucus, vomit, urine, or feces.  Ensure the mask fits over your nose and mouth tightly, and do not touch it during use. Throw out disposable facemasks and gloves after using them. Do not reuse. Wash your hands immediately after removing your facemask and gloves. If your personal clothing  becomes contaminated, carefully remove clothing and launder. Wash your hands after handling contaminated clothing. Place all used disposable facemasks, gloves, and other waste in a lined container before disposing them with other household waste. Remove gloves and wash your hands immediately after handling these items.  Do not share dishes, glasses, or other household items with the patient Avoid sharing household items. You should not share dishes, drinking glasses, cups, eating utensils, towels, bedding, or other items with a patient who is confirmed to have, or being evaluated for, COVID-19  infection. After the person uses these items, you should wash them thoroughly with soap and water.  Wash laundry thoroughly Immediately remove and wash clothes or bedding that have blood, body fluids, and/or secretions or excretions, such as sweat, saliva, sputum, nasal mucus, vomit, urine, or feces, on them. Wear gloves when handling laundry from the patient. Read and follow directions on labels of laundry or clothing items and detergent. In general, wash and dry with the warmest temperatures recommended on the label.  Clean all areas the individual has used often Clean all touchable surfaces, such as counters, tabletops, doorknobs, bathroom fixtures, toilets, phones, keyboards, tablets, and bedside tables, every day. Also, clean any surfaces that may have blood, body fluids, and/or secretions or excretions on them. Wear gloves when cleaning surfaces the patient has come in contact with. Use a diluted bleach solution (e.g., dilute bleach with 1 part bleach and 10 parts water) or a household disinfectant with a label that says EPA-registered for coronaviruses. To make a bleach solution at home, add 1 tablespoon of bleach to 1 quart (4 cups) of water. For a larger supply, add  cup of bleach to 1 gallon (16 cups) of water. Read labels of cleaning products and follow recommendations provided on product labels. Labels contain instructions for safe and effective use of the cleaning product including precautions you should take when applying the product, such as wearing gloves or eye protection and making sure you have good ventilation during use of the product. Remove gloves and wash hands immediately after cleaning.  Monitor yourself for signs and symptoms of illness Caregivers and household members are considered close contacts, should monitor their health, and will be asked to limit movement outside of the home to the extent possible. Follow the monitoring steps for close contacts listed on the  symptom monitoring form.  ? If you have additional questions, contact your local health department or call the epidemiologist on call at [phone] (available 24/7). ? This guidance is subject to change. For the most up-to-date guidance from [facility], please refer to their website: [employer]Met[employer].hu  You mention increased stress and anxiety at home as well.  Take hydroxyzine every 6 hours or as needed for increased anxiety or nerves.  Use melatonin dissolving tablet 5-10 mg 20 min before bed time to help with sleeping issues.  Flexeril is a muscle relaxer that may help ease off muscle tightness and spasms in your shoulder/neck. Call your primary care doctor and discuss your ongoing stress levels and anxiety. You may need other medications or therapy

## 2019-05-13 ENCOUNTER — Emergency Department (HOSPITAL_COMMUNITY): Payer: BC Managed Care – PPO

## 2019-05-13 ENCOUNTER — Other Ambulatory Visit: Payer: Self-pay

## 2019-05-13 ENCOUNTER — Emergency Department (HOSPITAL_COMMUNITY)
Admission: EM | Admit: 2019-05-13 | Discharge: 2019-05-13 | Discharge status: Against Medical Advice | Payer: BC Managed Care – PPO | Attending: Emergency Medicine | Admitting: Emergency Medicine

## 2019-05-13 ENCOUNTER — Encounter (HOSPITAL_COMMUNITY): Payer: Self-pay | Admitting: Emergency Medicine

## 2019-05-13 DIAGNOSIS — J069 Acute upper respiratory infection, unspecified: Secondary | ICD-10-CM | POA: Insufficient documentation

## 2019-05-13 DIAGNOSIS — Z5321 Procedure and treatment not carried out due to patient leaving prior to being seen by health care provider: Secondary | ICD-10-CM | POA: Diagnosis not present

## 2019-05-13 DIAGNOSIS — R0602 Shortness of breath: Secondary | ICD-10-CM | POA: Insufficient documentation

## 2019-05-13 DIAGNOSIS — J45909 Unspecified asthma, uncomplicated: Secondary | ICD-10-CM | POA: Insufficient documentation

## 2019-05-13 DIAGNOSIS — I10 Essential (primary) hypertension: Secondary | ICD-10-CM | POA: Insufficient documentation

## 2019-05-13 DIAGNOSIS — Z79899 Other long term (current) drug therapy: Secondary | ICD-10-CM | POA: Insufficient documentation

## 2019-05-13 NOTE — ED Notes (Signed)
Pt left without being seen.

## 2019-05-13 NOTE — ED Triage Notes (Signed)
Pt c/o shortness of breath x 5 days, pt seen here 10/10, negative for COVID, reports she has been using albuterol inhaler and taking prednisone as prescribed with no relief. Pt speaking in full sentences at this time. Denies chest pain/cough.

## 2019-05-14 ENCOUNTER — Encounter (HOSPITAL_COMMUNITY): Payer: Self-pay | Admitting: Emergency Medicine

## 2019-05-14 ENCOUNTER — Emergency Department (HOSPITAL_COMMUNITY): Payer: BC Managed Care – PPO

## 2019-05-14 ENCOUNTER — Emergency Department (HOSPITAL_COMMUNITY)
Admission: EM | Admit: 2019-05-14 | Discharge: 2019-05-14 | Disposition: A | Discharge status: Home / Self Care | Payer: BC Managed Care – PPO | Source: Home / Self Care | Attending: Emergency Medicine | Admitting: Emergency Medicine

## 2019-05-14 DIAGNOSIS — R0602 Shortness of breath: Secondary | ICD-10-CM | POA: Diagnosis not present

## 2019-05-14 DIAGNOSIS — J069 Acute upper respiratory infection, unspecified: Secondary | ICD-10-CM

## 2019-05-14 MED ORDER — AZITHROMYCIN 250 MG PO TABS: 250.0000 mg | ORAL_TABLET | Freq: Every day | ORAL | 0 refills | Status: DC

## 2019-05-14 NOTE — Discharge Instructions (Addendum)
You were seen today for ongoing respiratory symptoms.  Your chest x-ray is unchanged.  It indicates either a viral process or an atypical bacterial process.  Continue your prednisone.  You will be given a Z-Pak.  Use your inhaler as needed.

## 2019-05-14 NOTE — ED Triage Notes (Signed)
Patient here from home with complaints of SOB. Reports that she was seen for same and prescribed Prednisone. States that she wants an antibiotic, although chest x-ray did not show infection. Neg COVID. Also reports that she went to [facility] today for antibiotic but "there was a long wait" and decided to come here.

## 2019-05-14 NOTE — ED Provider Notes (Signed)
[facility]-EMERGENCY DEPT Provider Note   CSN: [id number] Arrival date & time: 05/13/19  2355     History   Chief Complaint Chief Complaint  Patient presents with   Shortness of Breath    HPI Patricia Gross is a 53 y.o. female.     HPI  This a 53 year old female with a history of asthma and hypertension who presents with upper respiratory symptoms.  Patient reports shortness of breath and dry cough.  She was seen and evaluated on October 10 for the same.  At that time she had a full work-up including chest x-ray and labs which were largely unrevealing.  She did have atypical changes on her x-ray suggestive of likely a viral process.  She was Covid negative.  She reports persistent symptoms.  She reports she has taken 2 days of prednisone with no relief.  She is taking her inhaler but it is not helping.  She reports ongoing shortness of breath.  She is requesting an antibiotic.  She denies any fevers.  No new sick contacts.  I reviewed her chart.  [facility] actually [facility] and had a repeat chest x-ray which is stable but she has some interstitial lung thickening which is indicative of likely atypical infection.  Past Medical History:  Diagnosis Date   Arthritis    Asthma    Hypertension     Patient Active Problem List   Diagnosis Date Noted   Lumbar pain 08/02/2017   Acute lumbar myofascial strain 08/02/2017   Musculoskeletal pain 08/02/2017   Left lower quadrant pain 07/29/2017   Hematuria 07/29/2017   Fatty liver 07/16/2016   Benign essential HTN 07/16/2016    Past Surgical History:  Procedure Laterality Date   GALLBLADDER SURGERY       OB History   No obstetric history on file.      Home Medications    Prior to Admission medications   Medication Sig Start Date End Date Taking? Authorizing Provider  albuterol (VENTOLIN HFA) 108 (90 Base) MCG/ACT inhaler Inhale 1-2 puffs into the lungs every 6  (six) hours as needed for wheezing or shortness of breath. 05/11/19   Liberty HandyGibbons, Claudia J, PA-C  atorvastatin (LIPITOR) 40 MG tablet Take 1 tablet (40 mg total) daily by mouth. Patient not taking: Reported on 07/31/2017 06/19/17   McVey, Madelaine BhatElizabeth Whitney, PA-C  azithromycin (ZITHROMAX) 250 MG tablet Take 1 tablet (250 mg total) by mouth daily. Take first 2 tablets together, then 1 every day until finished. 05/14/19   Michal Callicott, Mayer Maskerourtney F, MD  cyclobenzaprine (FLEXERIL) 10 MG tablet Take 1 tablet (10 mg total) by mouth 3 (three) times daily for 7 days. 05/11/19 05/18/19  Liberty HandyGibbons, Claudia J, PA-C  hydrOXYzine (ATARAX/VISTARIL) 25 MG tablet Take 1 tablet (25 mg total) by mouth every 6 (six) hours. 05/11/19   Liberty HandyGibbons, Claudia J, PA-C  ibuprofen (ADVIL,MOTRIN) 600 MG tablet Take 1 tablet (600 mg total) by mouth every 8 (eight) hours as needed. 08/02/17   Georgina QuintSagardia, Miguel Jose, MD  metoprolol tartrate (LOPRESSOR) 50 MG tablet Take 1 tablet (50 mg total) 2 (two) times daily by mouth. 06/16/17   McVey, Madelaine BhatElizabeth Whitney, PA-C  naproxen (NAPROSYN) 500 MG tablet Take 1 tablet (500 mg total) by mouth 2 (two) times daily with a meal. Patient not taking: Reported on 08/02/2017 07/31/17   Geoffery Lyonselo, Douglas, MD  predniSONE (DELTASONE) 10 MG tablet Take 4 tablets (40 mg total) by mouth daily for 5 days. 05/11/19 05/16/19  Kinnie Feil, PA-C  traMADol (ULTRAM) 50 MG tablet Take 1 tablet (50 mg total) by mouth every 6 (six) hours as needed. Patient not taking: Reported on 08/02/2017 07/31/17   Veryl Speak, MD    Family History Family History  Problem Relation Age of Onset   Aneurysm Mother    Alcohol abuse Father     Social History Social History   Tobacco Use   Smoking status: Never Smoker   Smokeless tobacco: Never Used  Substance Use Topics   Alcohol use: No   Drug use: No     Allergies   Patient has no known allergies.   Review of Systems Review of Systems  Constitutional: Negative for  fever.  HENT: Negative for congestion.   Respiratory: Positive for cough and shortness of breath. Negative for wheezing.   Cardiovascular: Negative for chest pain.  Gastrointestinal: Negative for abdominal pain and vomiting.  All other systems reviewed and are negative.    Physical Exam Updated Vital Signs BP (!) 147/84 (BP Location: Left Arm)    Pulse (!) 56    Temp 98 F (36.7 C) (Oral)    Resp 18    SpO2 100%   Physical Exam Vitals signs and nursing note reviewed.  Constitutional:      Appearance: She is well-developed. She is obese. She is not ill-appearing.  HENT:     Head: Normocephalic and atraumatic.  Eyes:     Pupils: Pupils are equal, round, and reactive to light.  Cardiovascular:     Rate and Rhythm: Normal rate and regular rhythm.     Heart sounds: Normal heart sounds.  Pulmonary:     Effort: Pulmonary effort is normal. No respiratory distress.     Breath sounds: No wheezing.  Abdominal:     General: Bowel sounds are normal.     Palpations: Abdomen is soft.  Musculoskeletal:     Right lower leg: No edema.     Left lower leg: No edema.  Skin:    General: Skin is warm and dry.  Neurological:     Mental Status: She is alert and oriented to person, place, and time.  Psychiatric:        Mood and Affect: Mood normal.      ED Treatments / Results  Labs (all labs ordered are listed, but only abnormal results are displayed) Labs Reviewed - No data to display  EKG EKG Interpretation  Date/Time:  Tuesday May 14 2019 00:14:51 EDT Ventricular Rate:  60 PR Interval:    QRS Duration: 92 QT Interval:  415 QTC Calculation: 415 R Axis:   12 Text Interpretation:  Sinus rhythm Abnormal R-wave progression, early transition Confirmed by Thayer Jew [phone]) on 05/14/2019 12:37:45 AM   Radiology Dg Chest 2 View  Result Date: 05/13/2019 CLINICAL DATA:  Shortness of breath and chest pain EXAM: CHEST - 2 VIEW COMPARISON:  May 11, 2019 FINDINGS:  Interstitium remains mildly thickened without focal edema or consolidation. Heart size and pulmonary vascularity are normal. No adenopathy. There is mild degenerative change in the thoracic spine. No pneumothorax. IMPRESSION: Stable mild interstitial thickening of uncertain etiology. No frank edema or consolidation. Cardiac silhouette within normal limits. No evident adenopathy. Electronically Signed   By: Lowella Grip III M.D.   On: 05/13/2019 21:35    Procedures Procedures (including critical care time)  Medications Ordered in ED Medications - No data to display   Initial Impression / Assessment and Plan / ED Course  I have  reviewed the triage vital signs and the nursing notes.  Pertinent labs & imaging results that were available during my care of the patient were reviewed by me and considered in my medical decision making (see chart for details).        Patient presents with continued upper respiratory symptoms and shortness of breath.  She is overall nontoxic-appearing and vital signs are reassuring.  Highly suspicious of viral etiology although an atypical bacterial infection or walking pneumonia could also present this way.  She is nontoxic and her breath sounds are clear.  I discussed with her that she needs to continue her prednisone.  Also continue inhaler as needed.  I will give her a Z-Pak but discussed with her that if this is a viral process it will have to improve on its own.  Patient stated understanding.  After history, exam, and medical workup I feel the patient has been appropriately medically screened and is safe for discharge home. Pertinent diagnoses were discussed with the patient. Patient was given return precautions.   Final Clinical Impressions(s) / ED Diagnoses   Final diagnoses:  URI, acute    ED Discharge Orders         Ordered    azithromycin (ZITHROMAX) 250 MG tablet  Daily     05/14/19 0154           Shon Baton, MD 05/14/19 0202

## 2019-05-17 ENCOUNTER — Encounter: Payer: Self-pay | Admitting: Internal Medicine

## 2019-05-23 ENCOUNTER — Encounter: Payer: Self-pay | Admitting: Internal Medicine

## 2019-05-23 ENCOUNTER — Ambulatory Visit (INDEPENDENT_AMBULATORY_CARE_PROVIDER_SITE_OTHER): Payer: BC Managed Care – PPO | Admitting: Internal Medicine

## 2019-05-23 ENCOUNTER — Telehealth: Payer: Self-pay | Admitting: Internal Medicine

## 2019-05-23 ENCOUNTER — Other Ambulatory Visit: Payer: Self-pay

## 2019-05-23 DIAGNOSIS — R0600 Dyspnea, unspecified: Secondary | ICD-10-CM | POA: Diagnosis not present

## 2019-05-23 DIAGNOSIS — R0609 Other forms of dyspnea: Secondary | ICD-10-CM

## 2019-05-23 LAB — CBC WITH DIFFERENTIAL/PLATELET
Basophils Absolute: 0 10*3/uL (ref 0.0–0.1)
Basophils Relative: 0.5 % (ref 0.0–3.0)
Eosinophils Absolute: 0.3 10*3/uL (ref 0.0–0.7)
Eosinophils Relative: 4.5 % (ref 0.0–5.0)
HCT: 42.1 % (ref 36.0–46.0)
Hemoglobin: 14.1 g/dL (ref 12.0–15.0)
Lymphocytes Relative: 39 % (ref 12.0–46.0)
Lymphs Abs: 2.3 10*3/uL (ref 0.7–4.0)
MCHC: 33.5 g/dL (ref 30.0–36.0)
MCV: 85.6 fl (ref 78.0–100.0)
Monocytes Absolute: 0.4 10*3/uL (ref 0.1–1.0)
Monocytes Relative: 6.3 % (ref 3.0–12.0)
Neutro Abs: 2.9 10*3/uL (ref 1.4–7.7)
Neutrophils Relative %: 49.7 % (ref 43.0–77.0)
Platelets: 186 10*3/uL (ref 150.0–400.0)
RBC: 4.92 Mil/uL (ref 3.87–5.11)
RDW: 13.9 % (ref 11.5–15.5)
WBC: 5.9 10*3/uL (ref 4.0–10.5)

## 2019-05-23 LAB — TSH: TSH: 2.49 u[IU]/mL (ref 0.35–4.50)

## 2019-05-23 NOTE — Progress Notes (Signed)
Patricia Gross, female    DOB: 22-Aug-1965,    MRN: [id number]   Brief patient profile:  53 yo female never smoker from [place] in [place] since 2002 with h/o asthma as child  But outgrew by age 75 but moved to [place] and did better and no need for any meds until around 2010 started needed albuterol just with spring and same pattern abruptly new pattern of  05/08/19  sob with bilateral sym upper post cp R > L s cough itching /sneezing to  [facility] ER / [facility] ER no better so referred to pulmonary clinic 05/23/2019 by Brantley Stage   With neg resp to saba neb     History of Present Illness  05/23/2019  Pulmonary/ 1st office eval/Patricia Gross  Chief Complaint  Patient presents with  . Pulmonary Consult    Referred by Brantley Stage, PA.  Pt c/o SOB and "lung pain" for the past 3-4 wks.   Dyspnea:  Has to walk slowly now/ can't do steps  - also having sense she can't get her breath at rest "feels restricted"  Cough: no never did  Sleep: bed is flat/ 2 pillows on side - feels better supine and symtpoms not waking her up SABA use: no better   No obvious day to day or daytime variability or assoc excess/ purulent sputum or mucus plugs or hemoptysis or   subjective wheeze or overt sinus or hb symptoms.   Sleeping as above without nocturnal  or early am exacerbation  of respiratory  c/o's or need for noct saba. Also denies any obvious fluctuation of symptoms with weather or environmental changes or other aggravating or alleviating factors except as outlined above   No unusual exposure hx or h/o childhood pna  or knowledge of premature birth.  Current Allergies, Complete Past Medical History, Past Surgical History, Family History, and Social History were reviewed in [employer] record.  ROS  The following are not active complaints unless bolded Hoarseness, sore throat, dysphagia, dental problems, itching, sneezing,  nasal congestion or discharge of excess mucus or purulent secretions, ear ache,    fever, chills, sweats, unintended wt loss or wt gain, classically pleuritic or exertional cp,  orthopnea pnd or arm/hand swelling  or leg swelling, presyncope, palpitations, abdominal pain, anorexia, nausea, vomiting, diarrhea  or change in bowel habits or change in bladder habits, change in stools or change in urine, dysuria, hematuria,  rash, arthralgias, visual complaints, headache, numbness, weakness or ataxia or problems with walking or coordination,  change in mood or  memory.           Past Medical History:  Diagnosis Date  . Arthritis   . Asthma   . Hypertension     Outpatient Medications Prior to Visit  Medication Sig Dispense Refill  . albuterol (VENTOLIN HFA) 108 (90 Base) MCG/ACT inhaler Inhale 1-2 puffs into the lungs every 6 (six) hours as needed for wheezing or shortness of breath. 18 g 0  . metoprolol tartrate (LOPRESSOR) 50 MG tablet Take 1 tablet (50 mg total) 2 (two) times daily by mouth. 90 tablet 1  . montelukast (SINGULAIR) 10 MG tablet Take 10 mg by mouth at bedtime.    Marland Kitchen atorvastatin (LIPITOR) 40 MG tablet Take 1 tablet (40 mg total) daily by mouth. (Patient not taking: Reported on 07/31/2017) 90 tablet 3  . azithromycin (ZITHROMAX) 250 MG tablet Take 1 tablet (250 mg total) by mouth daily. Take first 2 tablets together, then 1 every day until  finished. 6 tablet 0  . hydrOXYzine (ATARAX/VISTARIL) 25 MG tablet Take 1 tablet (25 mg total) by mouth every 6 (six) hours. 12 tablet 0  . ibuprofen (ADVIL,MOTRIN) 600 MG tablet Take 1 tablet (600 mg total) by mouth every 8 (eight) hours as needed. 30 tablet 1  . naproxen (NAPROSYN) 500 MG tablet Take 1 tablet (500 mg total) by mouth 2 (two) times daily with a meal. (Patient not taking: Reported on 08/02/2017) 15 tablet 0  . traMADol (ULTRAM) 50 MG tablet Take 1 tablet (50 mg total) by mouth every 6 (six) hours as needed. (Patient not taking: Reported on 08/02/2017) 12 tablet 0      Objective:     BP 132/80 (BP Location: Left  Arm, Cuff Size: Normal)   Pulse 89   Temp (!) 97 F (36.1 C) (Temporal)   Ht 5\' 4"  (1.626 m)   Wt 228 lb (103.4 kg)   SpO2 97% Comment: on RA  BMI 39.14 kg/m   SpO2: 97 %(on RA)    MO (by BMI)  female with obvious sigh breaths at rest.   HEENT : pt wearing mask not removed for exam due to covid -19 concerns.    NECK :  without JVD/Nodes/TM/ nl carotid upstrokes bilaterally   LUNGS: no acc muscle use,  Nl contour chest which is clear to A and P bilaterally without cough on insp or exp maneuvers   CV:  RRR  no s3 or murmur or increase in P2, and no edema   ABD: quite obese but  soft and nontender with nl inspiratory excursion in the supine position. No bruits or organomegaly appreciated, bowel sounds nl  MS:  Nl gait/ ext warm without deformities, calf tenderness, cyanosis or clubbing No obvious joint restrictions   SKIN: warm and dry without lesions    NEURO:  alert, approp, nl sensorium with  no motor or cerebellar deficits apparent.     I personally reviewed images and agree with radiology impression as follows:  CXR:   05/13/2019 Stable mild interstitial thickening of uncertain etiology. No frank edema or consolidation. Cardiac silhouette within normal limits. No evident adenopathy.   Labs ordered/ reviewed:      Chemistry      Component Value Date/Time   NA 140 05/11/2019 1745   NA 143 06/16/2017 1057   K 3.9 05/11/2019 1745   CL 104 05/11/2019 1745   CO2 26 05/11/2019 1745   BUN 8 05/11/2019 1745   BUN 14 06/16/2017 1057   CREATININE 0.62 05/11/2019 1745      Component Value Date/Time   CALCIUM 9.3 05/11/2019 1745   ALKPHOS 71 06/16/2017 1057   AST 30 06/16/2017 1057   ALT 45 (H) 06/16/2017 1057   BILITOT 0.5 06/16/2017 1057        Lab Results  Component Value Date   WBC 5.9 05/23/2019   HGB 14.1 05/23/2019   HCT 42.1 05/23/2019   MCV 85.6 05/23/2019   PLT 186.0 05/23/2019       EOS                                                                0.3  05/23/2019   Lab Results  Component Value Date   DDIMER 0.42 05/23/2019      Lab Results  Component Value Date   TSH 2.49 05/23/2019      Labs ordered 05/23/2019  :  IgE           Assessment   No problem-specific Assessment & Plan notes found for this encounter.     Sandrea HughsMichael Roselia Snipe, MD 05/23/2019

## 2019-05-23 NOTE — Telephone Encounter (Signed)
Call returned to patient, confirmed DOB, requesting results of labs. Made aware we are still waiting on the results of her IGE but MW would have a nurse to call her once they were all resulted. Voiced understanding. Nothing further needed at this time.

## 2019-05-23 NOTE — Patient Instructions (Addendum)
Continue singulair 10 mg each pm as it takes several weeks to help breathing but we may stop it when you return if not better  Please remember to go to the lab department   for your tests - we will call you with the results when they are available and order additional testing then if indicated.      Please schedule a follow up office visit in 2weeks, sooner if needed  with all medications /inhalers/ solutions in hand so we can verify exactly what you are taking. This includes all medications from all doctors and over the counters

## 2019-05-24 ENCOUNTER — Telehealth: Payer: Self-pay | Admitting: Internal Medicine

## 2019-05-24 ENCOUNTER — Other Ambulatory Visit: Payer: Self-pay | Admitting: Internal Medicine

## 2019-05-24 ENCOUNTER — Encounter: Payer: Self-pay | Admitting: Internal Medicine

## 2019-05-24 DIAGNOSIS — R0609 Other forms of dyspnea: Secondary | ICD-10-CM

## 2019-05-24 LAB — IGE: IgE (Immunoglobulin E), Serum: 109 kU/L (ref <=114)

## 2019-05-24 LAB — D-DIMER, QUANTITATIVE: D-Dimer, Quant: 0.42 mcg/mL FEU (ref <0.50)

## 2019-05-24 NOTE — Assessment & Plan Note (Addendum)
Onset  05/08/2019 assoc the sign breathing at rest  - 05/23/2019   Walked RA  2 laps @  approx 254ft each @ fast pace  stopped due to  End of study, sob with sats 94%    Symptoms are markedly disproportionate to objective findings and not clear to what extent this is actually a pulmonary  problem but pt does appear to have difficult to sort out respiratory symptoms of unknown origin for which  DDX  = almost all start with A and  include Adherence, Ace Inhibitors, Acid Reflux, Active Sinus Disease, Alpha 1 Antitripsin deficiency, Anxiety masquerading as Airways dz,  ABPA,  Allergy(esp in young), Aspiration (esp in elderly), Adverse effects of meds,  Active smoking or Vaping, A bunch of PE's/clot burden (a few small clots can't cause this syndrome unless there is already severe underlying pulm or vascular dz with poor reserve),  Anemia or thyroid disorder, plus two Bs  = Bronchiectasis and Beta blocker use..and one C= CHF     Adherence is always the initial "prime suspect" and is a multilayered concern that requires a "trust but verify" approach in every patient - starting with knowing how to use medications, especially inhalers, correctly, keeping up with refills and understanding the fundamental difference between maintenance and prns vs those medications only taken for a very short course and then stopped and not refilled.  - advised to return with all meds in hand using a trust but verify approach to confirm accurate Medication  Reconciliation The principal here is that until we are certain that the  patients are doing what we've asked, it makes no sense to ask them to do more.   ? Allergies/ asthma > no cough or noct symptoms or response to saba make this unlikely but does have slt elevated eos so rec continue singulair   ? ABPA > check IgE  ? Anxiety > usually at the bottom of this list of usual suspects but should be much higher on this pt's based on H and P and may interfere with adherence and also  interpretation of response or lack thereof to symptom management which can be quite subjective.   ? Adverse drug effects > none of the usual suspects listed   ? A bunch of PE's > note cp is bilateral and sym at apices of lung fields and D dimer nl - while a normal  or high normal value (seen commonly in the elderly or chronically ill)  may miss small peripheral pe, the clot burden with sob is moderately high and the d dimer  has a very high neg pred value if used in this setting and PE is very unlikely here.  ? BB effects > unlikely with relatively low dose of lopressor but needs to be kept in mind.   ? CHF >  W/u for IHD in ER but needs ECHO for PH > ordered.    Total time devoted to counseling  > 50 % of initial 60 min office visit:  reviewed case with pt/  directly observed portions of ambulatory 02 saturation study/ discussion of options/alternatives/ personally creating written customized instructions  in presence of pt  then going over those specific  Instructions directly with the pt including how to use all of the meds but in particular covering each new medication in detail and the difference between the maintenance= "automatic" meds and the prns using an action plan format for the latter (If this problem/symptom => do that organization reading Left  to right).  Please see AVS from this visit for a full list of these instructions which I personally wrote for this pt and  are unique to this visit.

## 2019-05-24 NOTE — Progress Notes (Signed)
ATC, NA, VM not set up

## 2019-05-24 NOTE — Telephone Encounter (Signed)
I was transferred call from patient who confirmed name and date of birth. Patient wanted to know what her lab results were and why she was having an echo done.  Relayed results per Dr. Melvyn Novas (below) and explained about the echo. Patient verbalized understanding and stated she feels better knowing these answer.  Nothing further needed at this time.  Results of labs: "Studies are unremarkable with no evidence of blood clot but needs echo to be sure about her heart function"

## 2019-05-24 NOTE — Telephone Encounter (Signed)
ATC NA again  No option to leave VM  I remember the AVS that I handed the pt yesterday, it said that she needed ECHO, that is why I went ahead and ordered this

## 2019-05-30 ENCOUNTER — Other Ambulatory Visit: Payer: Self-pay

## 2019-05-30 ENCOUNTER — Ambulatory Visit (HOSPITAL_COMMUNITY): Payer: BC Managed Care – PPO | Attending: Cardiovascular Disease

## 2019-05-30 DIAGNOSIS — R0600 Dyspnea, unspecified: Secondary | ICD-10-CM | POA: Diagnosis not present

## 2019-05-30 DIAGNOSIS — R0609 Other forms of dyspnea: Secondary | ICD-10-CM

## 2019-05-30 NOTE — Progress Notes (Signed)
Spoke with pt and notified of results per Dr. Wert. Pt verbalized understanding and denied any questions. 

## 2019-05-31 ENCOUNTER — Encounter: Payer: Self-pay | Admitting: Internal Medicine

## 2019-05-31 ENCOUNTER — Ambulatory Visit (INDEPENDENT_AMBULATORY_CARE_PROVIDER_SITE_OTHER): Payer: BC Managed Care – PPO | Admitting: Internal Medicine

## 2019-05-31 DIAGNOSIS — R0600 Dyspnea, unspecified: Secondary | ICD-10-CM

## 2019-05-31 DIAGNOSIS — I10 Essential (primary) hypertension: Secondary | ICD-10-CM

## 2019-05-31 DIAGNOSIS — R0609 Other forms of dyspnea: Secondary | ICD-10-CM

## 2019-05-31 MED ORDER — BISOPROLOL FUMARATE 5 MG PO TABS: 5.0000 mg | ORAL_TABLET | Freq: Every day | ORAL | 11 refills | Status: DC

## 2019-05-31 NOTE — Patient Instructions (Addendum)
Stop lopressor and start bisoprolol 5 mg daily in its place   To get the most out of exercise, you need to be continuously aware that you are short of breath, but never out of breath, for 30 minutes daily. As you improve, it will actually be easier for you to do the same amount of exercise  in  30 minutes so always push to the level where you are short of breath.     Ideally, Make sure you check your oxygen saturations at highest level of activity at least once a week to be sure it stays over 90% call me if not    Please schedule a follow up office visit in 6 weeks, call sooner if needed

## 2019-05-31 NOTE — Progress Notes (Signed)
Patricia Gross, female    DOB: 20-Aug-1965,    MRN: [id number]   Brief patient profile:  53 yo female never smoker from [place] in [place] since 2002 with h/o asthma as child  But outgrew by age 21 but moved to [place] and did better and no need for any meds until around 2010 started needed albuterol just with spring and same pattern abruptly new pattern of  05/08/19  sob with bilateral sym upper post cp R > L s cough itching /sneezing to  [facility] / [facility] ER no better so referred to pulmonary clinic 05/23/2019 by Barry Brunner   With neg resp to saba neb     History of Present Illness  05/23/2019  Pulmonary/ 1st office eval/Patricia Gross  Chief Complaint  Patient presents with  . Pulmonary Consult    Referred by Barry Brunner, PA.  Pt c/o SOB and "lung pain" for the past 3-4 wks.   Dyspnea:  Has to walk slowly now/ can't do steps  - also having sense she can't get her breath at rest "feels restricted"  Cough: no never did  Sleep: bed is flat/ 2 pillows on side - feels better supine and symtpoms not waking her up SABA use: no better  rec Continue singulair 10 mg each pm as it takes several weeks to help breathing but we may stop it when you return if not better Please remember to go to the lab department   for your tests - we will call you with the results when they are available and order additional testing then if indicated.  Please schedule a follow up office visit in 2weeks, sooner if needed  with all medications /inhalers/ solutions in hand so we can verify exactly what you are taking. This includes all medications from all doctors and over the counters   05/31/2019  f/u ov/Patricia Gross re: sob  Chief Complaint  Patient presents with  . Follow-up    Breathing is about the same since the last visit. She is using her albuterol inhaler once per day.   Dyspnea:  More sob at rest than ex  Cough: no Sleeping: able to sleep flat  SABA use: ventolin use once daily but not helping 02: not using    No obvious  day to day or daytime variability or assoc excess/ purulent sputum or mucus plugs or hemoptysis or cp or chest tightness, subjective wheeze or overt sinus or hb symptoms.   Sleeping as above without nocturnal  or early am exacerbation  of respiratory  c/o's or need for noct saba. Also denies any obvious fluctuation of symptoms with weather or environmental changes or other aggravating or alleviating factors except as outlined above   No unusual exposure hx or h/o childhood pna/ asthma or knowledge of premature birth.  Current Allergies, Complete Past Medical History, Past Surgical History, Family History, and Social History were reviewed in [employer] record.  ROS  The following are not active complaints unless bolded Hoarseness, sore throat, dysphagia, dental problems, itching, sneezing,  nasal congestion or discharge of excess mucus or purulent secretions, ear ache,   fever, chills, sweats, unintended wt loss or wt gain, classically pleuritic or exertional cp,  orthopnea pnd or arm/hand swelling  or leg swelling, presyncope, palpitations, abdominal pain, anorexia, nausea, vomiting, diarrhea  or change in bowel habits or change in bladder habits, change in stools or change in urine, dysuria, hematuria,  rash, arthralgias, visual complaints, headache, numbness, weakness or ataxia or problems  with walking or coordination,  change in mood or  memory.        Current Meds  Medication Sig  . albuterol (VENTOLIN HFA) 108 (90 Base) MCG/ACT inhaler Inhale 1-2 puffs into the lungs every 6 (six) hours as needed for wheezing or shortness of breath.  . metoprolol tartrate (LOPRESSOR) 50 MG tablet Take 1 tablet (50 mg total) 2 (two) times daily by mouth.  . montelukast (SINGULAIR) 10 MG tablet Take 10 mg by mouth at bedtime.                            Objective:       Wt Readings from Last 3 Encounters:  05/31/19 228 lb 12.8 oz (103.8 kg)  05/23/19 228 lb (103.4 kg)   05/11/19 230 lb (104.3 kg)     amb female MO by bmi  BP 120/78 (BP Location: Left Arm, Cuff Size: Normal)   Pulse 66   Temp 98 F (36.7 C) (Temporal)   Ht 5\' 4"  (1.626 m)   Wt 228 lb 12.8 oz (103.8 kg)   SpO2 100% Comment: on RA  BMI 39.27 kg/m     HEENT : pt wearing mask not removed for exam due to covid -19 concerns.    NECK :  without JVD/Nodes/TM/ nl carotid upstrokes bilaterally   LUNGS: no acc muscle use,  Nl contour chest which is clear to A and P bilaterally without cough on insp or exp maneuvers   CV:  RRR  no s3 or murmur or increase in P2, and no edema   ABD: quite obese but  soft and nontender with nl inspiratory excursion in the supine position. No bruits or organomegaly appreciated, bowel sounds nl  MS:  Nl gait/ ext warm without deformities, calf tenderness, cyanosis or clubbing No obvious joint restrictions   SKIN: warm and dry without lesions    NEURO:  alert, approp, nl sensorium with  no motor or cerebellar deficits apparent.           Assessment

## 2019-06-02 ENCOUNTER — Encounter: Payer: Self-pay | Admitting: Internal Medicine

## 2019-06-02 NOTE — Assessment & Plan Note (Addendum)
Body mass index is 39.27 kg/m.   Lab Results  Component Value Date   TSH 2.49 05/23/2019     Contributing to gerd risk/ doe/reviewed the need and the process to achieve and maintain neg calorie balance > defer f/u primary care including intermittently monitoring thyroid status    I had an extended discussion with the patient reviewing all relevant studies completed to date and  lasting 15 to 20 minutes of a 25 minute summary final office visit .   I directly observed portions of ambulatory 02 saturation study which added to face to face time.  Each maintenance medication was reviewed in detail including most importantly the difference between maintenance and prns and under what circumstances the prns are to be triggered using an action plan format that is not reflected in the computer generated alphabetically organized AVS.     Please see AVS for specific instructions unique to this visit that I personally wrote and verbalized to the the pt in detail and then reviewed with pt  by my nurse highlighting any  changes in therapy recommended at today's visit to their plan of care.

## 2019-06-02 NOTE — Assessment & Plan Note (Signed)
Onset  05/08/2019 assoc the sigh breathing at rest  - 05/23/2019   Walked RA  2 laps @  approx 248ft each @ fast pace  stopped due to  End of study, sob with sats 94%  - 05/30/2019   Echo ok x LVH  - 05/31/2019   Walked RA  2 laps @  approx 282ft each @ fast pace  stopped due to  End of study, sats 98% no sob   No evidence of a significant cardiac or pulmonary problem here though I would rec a trial of more selective BB to be sure theres is a component of occult asthma playing a role since "failure to respond to saba " either means the saba is being blocked by the BB or she doesn't have any asthma at all.   If any further question could do MCT on Bisoprolol but assured here this should not be the case.  Rec:  Daily ex x 30 min monitoring sats at peak ex  F/u here prn

## 2019-06-02 NOTE — Assessment & Plan Note (Signed)
Changed lopressor to bisoprolol 05/31/2019 due to ? Occult asthma  In the setting of respiratory symptoms of unknown etiology,  It would be preferable to use bystolic, the most beta -1  selective Beta blocker available in sample form, with bisoprolol the most selective generic choice  on the market, at least on a trial basis, to make sure the spillover Beta 2 effects of the less specific Beta blockers are not contributing to this patient's symptoms.    >>> rec bisoprolol 5 mg daily in place of lopressor.

## 2019-07-01 ENCOUNTER — Ambulatory Visit: Payer: BC Managed Care – PPO | Admitting: Physician Assistant

## 2019-07-15 ENCOUNTER — Ambulatory Visit: Payer: BC Managed Care – PPO | Admitting: Internal Medicine

## 2019-11-15 DIAGNOSIS — M7751 Other enthesopathy of right foot: Secondary | ICD-10-CM | POA: Diagnosis not present

## 2019-11-15 DIAGNOSIS — M722 Plantar fascial fibromatosis: Secondary | ICD-10-CM | POA: Diagnosis not present

## 2019-12-02 ENCOUNTER — Other Ambulatory Visit: Payer: Self-pay

## 2019-12-02 ENCOUNTER — Encounter: Payer: Self-pay | Admitting: Physician Assistant

## 2019-12-02 ENCOUNTER — Ambulatory Visit (INDEPENDENT_AMBULATORY_CARE_PROVIDER_SITE_OTHER): Payer: BC Managed Care – PPO | Admitting: Physician Assistant

## 2019-12-02 DIAGNOSIS — M722 Plantar fascial fibromatosis: Secondary | ICD-10-CM | POA: Diagnosis not present

## 2019-12-02 DIAGNOSIS — M7661 Achilles tendinitis, right leg: Secondary | ICD-10-CM | POA: Diagnosis not present

## 2019-12-02 MED ORDER — LIDOCAINE HCL 1 % IJ SOLN
1.0000 mL | INTRAMUSCULAR | Status: AC | PRN
  Administered 2019-12-02: 1 mL

## 2019-12-02 MED ORDER — METHYLPREDNISOLONE ACETATE 40 MG/ML IJ SUSP
40.0000 mg | INTRAMUSCULAR | Status: AC | PRN
  Administered 2019-12-02: 40 mg

## 2019-12-02 NOTE — Progress Notes (Signed)
Office Visit Note   Patient: Patricia Gross           Date of Birth: Feb 16, 1966           MRN: [id number] Visit Date: 12/02/2019              Requested by: Sandre Kitty, PA-C [address] [place],  [place] [id number] PCP: Sandre Kitty, PA-C   Assessment & Plan: Visit Diagnoses:  1. Achilles tendinitis, right leg   2. Plantar fasciitis of right foot     Plan: We will send her to physical therapy for gastrocsoleus stretching, home exercise program and modalities.  Discussed with her stretching activities that she can do at home and also shoewear.  She is to avoid all open back shoes wearing a shoe that with the back or strap.  Follow-up with [place] in 4 weeks see how she is doing.  When she finished her Medrol dose pack she can begin using Voltaren gel up to 4 times daily to the heel and the Achilles on the right side.  She will discontinue her hard inserts in both shoes.  Follow-Up Instructions: Return in about 4 weeks (around 12/30/2019).   Orders:  No orders of the defined types were placed in this encounter.  No orders of the defined types were placed in this encounter.     Procedures: Foot Inj  Date/Time: 12/02/2019 2:10 PM Performed by: Kirtland Bouchard, PA-C Authorized by: Kirtland Bouchard, PA-C   Consent Given by:  Patient Site marked: the procedure site was marked   Timeout: prior to procedure the correct patient, procedure, and site was verified   Indications:  Fasciitis and pain Condition: Plantar Fasciitis   Location: right plantar fascia muscle   Approach:  Plantar Medications:  1 mL lidocaine 1 %; 40 mg methylPREDNISolone acetate 40 MG/ML     Clinical Data: No additional findings.   Subjective: Chief Complaint  Patient presents with  . Right Foot - Pain    Right heel pain    HPI Patricia Gross comes in today for new complaint of right foot pain for the past 6 months.  She seen by a podiatrist who had a value shoes.  She is also  wearing compression socks and has inserts in her shoes.  Pain is mostly medial aspect of the right heel and arch region.  Pain is worse with the first step in the morning.  Sharp stabbing pain.  She has had no physical therapy.  She is currently on a tapering dose pack.  No known injury. Review of Systems See HPI otherwise negative  Objective: Vital Signs: There were no vitals taken for this visit.  Physical Exam Constitutional:      Appearance: She is not ill-appearing or diaphoretic.  Pulmonary:     Effort: Pulmonary effort is normal.  Neurological:     Mental Status: She is alert and oriented to person, place, and time.  Psychiatric:        Mood and Affect: Mood normal.     Ortho Exam Bilateral feet: Achilles are intact bilaterally.  Tenderness over the right Achilles insertion.  Nontender over the mid Achilles bilaterally.  Gastrocs are tight bilaterally.  Calves are supple and nontender.  Dorsal pedal pulses intact bilaterally.  No impending rashes healing ulcerations bilateral feet.  Of 5 strength with inversion eversion against resistance bilateral feet.  Dorsiflexion plantarflexion bilateral ankles intact.  Tenderness over the medial tubercle of the calcaneus right  foot.  No tenderness over the posterior tibial tendon or peroneal tendons bilaterally. Specialty Comments:  No specialty comments available.  Imaging: No results found.   PMFS History: Patient Active Problem List   Diagnosis Date Noted  . Morbid obesity due to excess calories ([place]) complicated by HBP/LVH 78/24/2353  . DOE (dyspnea on exertion) 05/23/2019  . Lumbar pain 08/02/2017  . Acute lumbar myofascial strain 08/02/2017  . Musculoskeletal pain 08/02/2017  . Left lower quadrant pain 07/29/2017  . Hematuria 07/29/2017  . Fatty liver 07/16/2016  . Benign essential HTN 07/16/2016   Past Medical History:  Diagnosis Date  . Arthritis   . Asthma   . Hypertension     Family History  Problem Relation Age  of Onset  . Aneurysm Mother   . Alcohol abuse Father     Past Surgical History:  Procedure Laterality Date  . GALLBLADDER SURGERY     Social History   Occupational History  . Occupation: cuts vegetables  Tobacco Use  . Smoking status: Never Smoker  . Smokeless tobacco: Never Used  Substance and Sexual Activity  . Alcohol use: No  . Drug use: No  . Sexual activity: Not on file

## 2020-01-01 ENCOUNTER — Encounter: Payer: Self-pay | Admitting: Acute Care

## 2020-01-01 ENCOUNTER — Ambulatory Visit (INDEPENDENT_AMBULATORY_CARE_PROVIDER_SITE_OTHER): Payer: BC Managed Care – PPO | Admitting: Acute Care

## 2020-01-01 ENCOUNTER — Other Ambulatory Visit: Payer: Self-pay

## 2020-01-01 VITALS — BP 130/74 | HR 60 | Temp 98.5°F | Ht 64.0 in | Wt 230.4 lb

## 2020-01-01 DIAGNOSIS — G4733 Obstructive sleep apnea (adult) (pediatric): Secondary | ICD-10-CM | POA: Diagnosis not present

## 2020-01-01 DIAGNOSIS — E785 Hyperlipidemia, unspecified: Secondary | ICD-10-CM

## 2020-01-01 DIAGNOSIS — J45909 Unspecified asthma, uncomplicated: Secondary | ICD-10-CM

## 2020-01-01 NOTE — Progress Notes (Signed)
History of Present Illness Patricia Gross is a 54 y.o. female never smoker with history of asthma with negative response to saba neb most likely 2/2 Lopressor use. She is followed by Dr. Sherene Sires.    Brief patient profile:  54 yo female never smoker from [place] in [place] since 2002 with h/o asthma as child  But outgrew by age 69 but moved to [place] and did better and no need for any meds until around 2010 started needed albuterol just with spring and same pattern abruptly new pattern of  05/08/19  sob with bilateral sym upper post cp R > L s cough itching /sneezing to  [facility] ER / [facility] ER no better so referred to pulmonary clinic 05/23/2019 by Barry Brunner   With neg resp to saba neb She does take Singulair as maintenance allergy suppression therapy    01/01/2020 Pt. Presents for follow up. She was seen by Dr. Sherene Sires who had recommended bisoprolol 5 mg daily in place of lopressor, to see if she had better response to SABA nebs as needed for dyspnea/ asthma flare. She is returning to the office for recurrent breathing issues. Pt. States she has sensation of not being able to take a deep breath. She is using her nebulizer, and she still has the sensation that she cannot take a deep breath. She does not feel the nebs are helping as she does not feel this is asthma. She states she has had weight gain,about 10 pounds in the last year. Body mass index is 39.55 kg/m.Marland Kitchen She states she has been awakening with headache. Her daughter states she snores.She does have daytime sleepiness. She is very concerned about her elevated triglycerides. She has a very strong  Family history of brain aneurysm and heart disease. She states she has had 3 episodes of tingling in her abdomen that goes down her left arm. She is asymptomatic today, but has had this in the past. Last time was 1 week ago. No ankle swelling.She states this happens with exertion, and when she eats a lot.. We will refer to cardiology. She denies any chest pain,  orthopnea or hemoptysis.  Test Results: - 05/23/2019   [facility] RA  2 laps @  approx 268ft each @ fast pace  stopped due to  End of study, sob with sats 94%  - 05/30/2019   Echo ok x LVH  - 05/31/2019   Walked RA  2 laps @  approx 222ft each @ fast pace  stopped due to  End of study, sats 98% no sob   05/2019 Echo Left Ventricle: Left ventricular ejection fraction, by visual estimation, is 60 to 65%. The left ventricle has normal function. There is moderately increased left ventricular hypertrophy. Right Ventricle: The right ventricular size is normal. No increase in right ventricular wall thickness. Global RV systolic function is has normal systolic function. Left Atrium: Left atrial size was normal in size. Right Atrium: Right atrial size was normal in size Pericardium: There is no evidence of pericardial effusion. Mitral Valve: The mitral valve is normal in structure. No evidence of mitral valve stenosis by observation. No evidence of mitral valve regurgitation. Tricuspid Valve: The tricuspid valve is normal in structure. Tricuspid valve regurgitation is trivial.  CBC Latest Ref Rng & Units 05/23/2019 05/11/2019 07/29/2017  WBC 4.0 - 10.5 K/uL 5.9 6.8 5.8  Hemoglobin 12.0 - 15.0 g/dL 06.2 69.4 85.4  Hematocrit 36.0 - 46.0 % 42.1 44.4 41.0  Platelets 150.0 - 400.0 K/uL 186.0 212 -  BMP Latest Ref Rng & Units 05/11/2019 06/16/2017 03/04/2014  Glucose 70 - 99 mg/dL 981(X) 90 914(N)  BUN 6 - 20 mg/dL 8 14 11   Creatinine 0.44 - 1.00 mg/dL 8.29 5.62  BUN/Creat Ratio 9 - 23 - 23 -  Sodium 135 - 145 mmol/L 140 143 142  Potassium 3.5 - 5.1 mmol/L 3.9 4.0 3.9  Chloride 98 - 111 mmol/L 104 101 105  CO2 22 - 32 mmol/L 26 25 26   Calcium 8.9 - 10.3 mg/dL 9.3 9.6 9.3    BNP No results found for: BNP  ProBNP No results found for: PROBNP  PFT No results found for: FEV1PRE, FEV1POST, FVCPRE, FVCPOST, TLC, DLCOUNC, PREFEV1FVCRT, PSTFEV1FVCRT  No results found.   Past medical  hx Past Medical History:  Diagnosis Date  . Arthritis   . Asthma   . Hypertension      Social History   Tobacco Use  . Smoking status: Never Smoker  . Smokeless tobacco: Never Used  Substance Use Topics  . Alcohol use: No  . Drug use: No    Ms.Beamon reports that she has never smoked. She has never used smokeless tobacco. She reports that she does not drink alcohol or use drugs.  Tobacco Cessation: Never smoker  Past surgical hx, Family hx, Social hx all reviewed.  Current Outpatient Medications on File Prior to Visit  Medication Sig  . albuterol (VENTOLIN HFA) 108 (90 Base) MCG/ACT inhaler Inhale 1-2 puffs into the lungs every 6 (six) hours as needed for wheezing or shortness of breath.  . bisoprolol (ZEBETA) 5 MG tablet Take 1 tablet (5 mg total) by mouth daily.  . montelukast (SINGULAIR) 10 MG tablet Take 10 mg by mouth at bedtime.   No current facility-administered medications on file prior to visit.     No Known Allergies  Review Of Systems:  Constitutional:   No  weight loss, night sweats,  Fevers, chills,+  fatigue, or  lassitude.  HEENT:   No headaches,  Difficulty swallowing,  Tooth/dental problems, or  Sore throat,                No sneezing, itching, ear ache, nasal congestion, post nasal drip,   CV:  No chest pain,  Orthopnea, PND, swelling in lower extremities, anasarca, dizziness, palpitations, syncope.   GI  No heartburn, indigestion, abdominal pain, nausea, vomiting, diarrhea, change in bowel habits, loss of appetite, bloody stools.   Resp: + shortness of breath with exertion less at rest.  No excess mucus, no productive cough,  No non-productive cough,  No coughing up of blood.  No change in color of mucus.  No wheezing.  No chest wall deformity  Skin: no rash or lesions.  GU: no dysuria, change in color of urine, no urgency or frequency.  No flank pain, no hematuria   MS:  No joint pain or swelling.  No decreased range of motion.  No back  pain.  Psych:  No change in mood or affect. No depression or anxiety.  No memory loss.   Vital Signs BP 130/74 (BP Location: Left Arm)   Pulse 60   Temp 98.5 F (36.9 C) (Oral)   Ht 5\' 4"  (1.626 m)   Wt 230 lb 6.4 oz (104.5 kg)   SpO2 98% Comment: RA  BMI 39.55 kg/m    Physical Exam:  General- No distress,  A&Ox3, pleasant ENT: No sinus tenderness, TM clear, pale nasal mucosa, no oral exudate,no post nasal drip, no LAN Cardiac:  S1, S2, regular rate and rhythm, no murmur Chest: No wheeze/ rales/ dullness; no accessory muscle use, no nasal flaring, no sternal retractions Abd.: Soft Non-tender, ND, BS +, Body mass index is 39.55 kg/m. Ext: No clubbing cyanosis, edema Neuro:  normal strength, MAE x 4, A&O x 3 Skin: No rashes, No lesions,warm and dry Psych: normal mood and behavior   Assessment/Plan  Dyspnea on exertion Pt. States this does not feel like asthma Plan PFT's Work on physical deconditioning, start slow and slowly build up activity. Rescue inhaler prn shortness of breath or wheezing  Strong family history of CAD  Elevated Triglycerides Echo EF 60-65% HTN Plan Refer to cardiology Continue Zebeta  Suspected OSA Awakens with morning HA Daytime Fatigue Plan Home Sleep Study  Obesity Body mass index is 39.55 kg/m. Plan Refer to dietitian for weight management  This appointment was 40 min long with over 50% of the time in direct face-to-face patient care, assessment, plan of care, and follow-up.    Magdalen Spatz, NP 01/01/2020  7:45 PM

## 2020-01-01 NOTE — Patient Instructions (Addendum)
It is good to see you today. We will order Pulmonary  Function Tests. You will get a call to schedule. We will order a home sleep study. This will check for sleep apnea. We will call you with results Work on physical conditioning, start slow and try to increase exercise  as able. We will refer to cardiology for evaluation. Continue using your rescue inhaler for shortness of breath with exertion.  We will refer you to a dietitian for weight loss. Follow up after sleep study Please contact office for sooner follow up if symptoms do not improve or worsen or seek emergency care

## 2020-01-02 ENCOUNTER — Ambulatory Visit: Payer: BC Managed Care – PPO | Admitting: Physician Assistant

## 2020-01-06 ENCOUNTER — Other Ambulatory Visit (HOSPITAL_COMMUNITY): Payer: BC Managed Care – PPO

## 2020-01-06 ENCOUNTER — Ambulatory Visit: Payer: BC Managed Care – PPO | Attending: Internal Medicine

## 2020-01-06 DIAGNOSIS — Z20822 Contact with and (suspected) exposure to covid-19: Secondary | ICD-10-CM

## 2020-01-07 LAB — NOVEL CORONAVIRUS, NAA: SARS-CoV-2, NAA: NOT DETECTED

## 2020-01-07 LAB — SARS-COV-2, NAA 2 DAY TAT

## 2020-01-08 ENCOUNTER — Telehealth: Payer: Self-pay | Admitting: Internal Medicine

## 2020-01-08 NOTE — Telephone Encounter (Signed)
New Message  Pt is calling to make Lipid Appt   Please call back

## 2020-01-08 NOTE — Telephone Encounter (Signed)
Patient called and scheduled for 9/7 with Dr. Rennis Golden for lipid and added to wait list.

## 2020-01-09 ENCOUNTER — Other Ambulatory Visit: Payer: Self-pay | Admitting: *Deleted

## 2020-01-09 DIAGNOSIS — J45909 Unspecified asthma, uncomplicated: Secondary | ICD-10-CM

## 2020-01-15 DIAGNOSIS — R0602 Shortness of breath: Secondary | ICD-10-CM | POA: Diagnosis not present

## 2020-01-15 DIAGNOSIS — Z0000 Encounter for general adult medical examination without abnormal findings: Secondary | ICD-10-CM | POA: Diagnosis not present

## 2020-01-15 DIAGNOSIS — M545 Low back pain: Secondary | ICD-10-CM | POA: Diagnosis not present

## 2020-01-15 DIAGNOSIS — R5383 Other fatigue: Secondary | ICD-10-CM | POA: Diagnosis not present

## 2020-01-15 DIAGNOSIS — R350 Frequency of micturition: Secondary | ICD-10-CM | POA: Diagnosis not present

## 2020-01-16 DIAGNOSIS — L02219 Cutaneous abscess of trunk, unspecified: Secondary | ICD-10-CM | POA: Diagnosis not present

## 2020-01-29 DIAGNOSIS — M79605 Pain in left leg: Secondary | ICD-10-CM | POA: Diagnosis not present

## 2020-01-29 DIAGNOSIS — J45909 Unspecified asthma, uncomplicated: Secondary | ICD-10-CM | POA: Diagnosis not present

## 2020-01-29 DIAGNOSIS — M545 Low back pain: Secondary | ICD-10-CM | POA: Diagnosis not present

## 2020-01-29 DIAGNOSIS — M79604 Pain in right leg: Secondary | ICD-10-CM | POA: Diagnosis not present

## 2020-02-05 DIAGNOSIS — R945 Abnormal results of liver function studies: Secondary | ICD-10-CM | POA: Diagnosis not present

## 2020-02-05 DIAGNOSIS — Z01818 Encounter for other preprocedural examination: Secondary | ICD-10-CM | POA: Diagnosis not present

## 2020-02-05 DIAGNOSIS — Z1211 Encounter for screening for malignant neoplasm of colon: Secondary | ICD-10-CM | POA: Diagnosis not present

## 2020-02-12 DIAGNOSIS — G4710 Hypersomnia, unspecified: Secondary | ICD-10-CM | POA: Diagnosis not present

## 2020-02-12 DIAGNOSIS — J45909 Unspecified asthma, uncomplicated: Secondary | ICD-10-CM | POA: Diagnosis not present

## 2020-02-12 DIAGNOSIS — I10 Essential (primary) hypertension: Secondary | ICD-10-CM | POA: Diagnosis not present

## 2020-02-12 DIAGNOSIS — G2581 Restless legs syndrome: Secondary | ICD-10-CM | POA: Diagnosis not present

## 2020-02-25 DIAGNOSIS — M545 Low back pain: Secondary | ICD-10-CM | POA: Diagnosis not present

## 2020-02-25 DIAGNOSIS — I10 Essential (primary) hypertension: Secondary | ICD-10-CM | POA: Diagnosis not present

## 2020-02-25 DIAGNOSIS — R600 Localized edema: Secondary | ICD-10-CM | POA: Diagnosis not present

## 2020-02-25 DIAGNOSIS — J45909 Unspecified asthma, uncomplicated: Secondary | ICD-10-CM | POA: Diagnosis not present

## 2020-02-26 ENCOUNTER — Other Ambulatory Visit: Payer: Self-pay

## 2020-02-26 ENCOUNTER — Ambulatory Visit: Payer: BC Managed Care – PPO

## 2020-02-26 DIAGNOSIS — G4733 Obstructive sleep apnea (adult) (pediatric): Secondary | ICD-10-CM

## 2020-02-28 LAB — HM COLONOSCOPY

## 2020-03-11 DIAGNOSIS — Z1211 Encounter for screening for malignant neoplasm of colon: Secondary | ICD-10-CM | POA: Diagnosis not present

## 2020-03-11 DIAGNOSIS — E8801 Alpha-1-antitrypsin deficiency: Secondary | ICD-10-CM | POA: Diagnosis not present

## 2020-03-11 DIAGNOSIS — K760 Fatty (change of) liver, not elsewhere classified: Secondary | ICD-10-CM | POA: Diagnosis not present

## 2020-03-16 DIAGNOSIS — L308 Other specified dermatitis: Secondary | ICD-10-CM | POA: Diagnosis not present

## 2020-03-16 DIAGNOSIS — L821 Other seborrheic keratosis: Secondary | ICD-10-CM | POA: Diagnosis not present

## 2020-03-16 DIAGNOSIS — L818 Other specified disorders of pigmentation: Secondary | ICD-10-CM | POA: Diagnosis not present

## 2020-03-18 ENCOUNTER — Encounter (INDEPENDENT_AMBULATORY_CARE_PROVIDER_SITE_OTHER): Payer: Self-pay

## 2020-03-25 DIAGNOSIS — K760 Fatty (change of) liver, not elsewhere classified: Secondary | ICD-10-CM | POA: Diagnosis not present

## 2020-03-25 DIAGNOSIS — I10 Essential (primary) hypertension: Secondary | ICD-10-CM | POA: Diagnosis not present

## 2020-03-25 DIAGNOSIS — J45909 Unspecified asthma, uncomplicated: Secondary | ICD-10-CM | POA: Diagnosis not present

## 2020-03-25 DIAGNOSIS — G4733 Obstructive sleep apnea (adult) (pediatric): Secondary | ICD-10-CM | POA: Diagnosis not present

## 2020-03-27 ENCOUNTER — Telehealth: Payer: Self-pay | Admitting: Acute Care

## 2020-03-27 NOTE — Telephone Encounter (Signed)
Routing this to Maralyn Sago as an Financial planner.

## 2020-03-27 NOTE — Telephone Encounter (Signed)
FYI - Order was placed in June for hst.  Pt had study on 7/28 put on had 30 minutes downloaded on machine.  Pt was rescheduled for today and did not show for appt.  I called her and she stated she decided she didn't want to do study.  She said she previously had inlab study and everything was fine.  I asked if she discussed this with Maralyn Sago & she stated she did not.  I told her I would make SG aware she did not want to do hst.

## 2020-03-27 NOTE — Telephone Encounter (Signed)
Noted. Pt. Is aware of health  risks of untreated sleep apnea. It is her option to refuse recommended therapy .

## 2020-04-07 ENCOUNTER — Ambulatory Visit: Payer: BC Managed Care – PPO | Admitting: Internal Medicine

## 2020-04-16 DIAGNOSIS — D539 Nutritional anemia, unspecified: Secondary | ICD-10-CM | POA: Diagnosis not present

## 2020-04-16 DIAGNOSIS — R600 Localized edema: Secondary | ICD-10-CM | POA: Diagnosis not present

## 2020-04-16 DIAGNOSIS — E781 Pure hyperglyceridemia: Secondary | ICD-10-CM | POA: Diagnosis not present

## 2020-04-16 DIAGNOSIS — Z79899 Other long term (current) drug therapy: Secondary | ICD-10-CM | POA: Diagnosis not present

## 2020-04-16 DIAGNOSIS — Z20822 Contact with and (suspected) exposure to covid-19: Secondary | ICD-10-CM | POA: Diagnosis not present

## 2020-04-16 DIAGNOSIS — I10 Essential (primary) hypertension: Secondary | ICD-10-CM | POA: Diagnosis not present

## 2020-04-16 DIAGNOSIS — R5383 Other fatigue: Secondary | ICD-10-CM | POA: Diagnosis not present

## 2020-04-16 DIAGNOSIS — M129 Arthropathy, unspecified: Secondary | ICD-10-CM | POA: Diagnosis not present

## 2020-04-16 DIAGNOSIS — E559 Vitamin D deficiency, unspecified: Secondary | ICD-10-CM | POA: Diagnosis not present

## 2020-07-19 ENCOUNTER — Other Ambulatory Visit: Payer: Self-pay

## 2020-07-19 ENCOUNTER — Encounter (HOSPITAL_COMMUNITY): Payer: Self-pay

## 2020-07-19 ENCOUNTER — Emergency Department (HOSPITAL_COMMUNITY): Payer: BC Managed Care – PPO

## 2020-07-19 ENCOUNTER — Emergency Department (HOSPITAL_COMMUNITY)
Admission: EM | Admit: 2020-07-19 | Discharge: 2020-07-19 | Disposition: A | Discharge status: Home / Self Care | Payer: BC Managed Care – PPO | Attending: Emergency Medicine | Admitting: Emergency Medicine

## 2020-07-19 DIAGNOSIS — J45909 Unspecified asthma, uncomplicated: Secondary | ICD-10-CM | POA: Diagnosis not present

## 2020-07-19 DIAGNOSIS — Z79899 Other long term (current) drug therapy: Secondary | ICD-10-CM | POA: Diagnosis not present

## 2020-07-19 DIAGNOSIS — R519 Headache, unspecified: Secondary | ICD-10-CM | POA: Diagnosis not present

## 2020-07-19 DIAGNOSIS — I10 Essential (primary) hypertension: Secondary | ICD-10-CM | POA: Insufficient documentation

## 2020-07-19 MED ORDER — KETOROLAC TROMETHAMINE 60 MG/2ML IM SOLN
60.0000 mg | Freq: Once | INTRAMUSCULAR | Status: AC
  Administered 2020-07-19: 60 mg via INTRAMUSCULAR
  Filled 2020-07-19: qty 2

## 2020-07-19 NOTE — ED Triage Notes (Signed)
Pt reports pain in forehead and behind her eyes x1 week. Pt denies nausea.

## 2020-07-19 NOTE — ED Provider Notes (Signed)
[facility]-EMERGENCY DEPT Provider Note   CSN: [id number] Arrival date & time: 07/19/20  1938     History Chief Complaint  Patient presents with  . Migraine    Patricia Gross is a 54 y.o. female.  The history is provided by the patient. No language interpreter was used.  Headache Pain location:  Frontal Radiates to:  Does not radiate Timing:  Constant Progression:  Worsening Chronicity:  New Relieved by:  Nothing Worsened by:  Nothing Ineffective treatments:  None tried Associated symptoms: no vomiting        Past Medical History:  Diagnosis Date  . Arthritis   . Asthma   . Hypertension     Patient Active Problem List   Diagnosis Date Noted  . Morbid obesity due to excess calories (HCC) complicated by HBP/LVH 06/02/2019  . DOE (dyspnea on exertion) 05/23/2019  . Lumbar pain 08/02/2017  . Acute lumbar myofascial strain 08/02/2017  . Musculoskeletal pain 08/02/2017  . Left lower quadrant pain 07/29/2017  . Hematuria 07/29/2017  . Fatty liver 07/16/2016  . Benign essential HTN 07/16/2016    Past Surgical History:  Procedure Laterality Date  . GALLBLADDER SURGERY       OB History   No obstetric history on file.     Family History  Problem Relation Age of Onset  . Aneurysm Mother   . Alcohol abuse Father     Social History   Tobacco Use  . Smoking status: Never Smoker  . Smokeless tobacco: Never Used  Vaping Use  . Vaping Use: Never used  Substance Use Topics  . Alcohol use: No  . Drug use: No    Home Medications Prior to Admission medications   Medication Sig Start Date End Date Taking? Authorizing Provider  albuterol (VENTOLIN HFA) 108 (90 Base) MCG/ACT inhaler Inhale 1-2 puffs into the lungs every 6 (six) hours as needed for wheezing or shortness of breath. 05/11/19   Liberty Handy, PA-C  bisoprolol (ZEBETA) 5 MG tablet Take 1 tablet (5 mg total) by mouth daily. 05/31/19   Nyoka Cowden, MD  montelukast  (SINGULAIR) 10 MG tablet Take 10 mg by mouth at bedtime.    [provider]    Allergies    Patient has no known allergies.  Review of Systems   Review of Systems  Gastrointestinal: Negative for vomiting.  Neurological: Positive for headaches.  All other systems reviewed and are negative.   Physical Exam Updated Vital Signs BP (!) 152/93   Pulse 75   Temp 98.1 F (36.7 C) (Oral)   Resp 18   SpO2 99%   Physical Exam Vitals and nursing note reviewed.  Constitutional:      Appearance: She is well-developed and well-nourished.  HENT:     Head: Normocephalic.     Nose: Nose normal.     Mouth/Throat:     Mouth: Mucous membranes are moist.  Eyes:     Extraocular Movements: EOM normal.     Pupils: Pupils are equal, round, and reactive to light.  Cardiovascular:     Rate and Rhythm: Normal rate and regular rhythm.     Pulses: Normal pulses.  Pulmonary:     Effort: Pulmonary effort is normal.  Abdominal:     General: There is no distension.  Musculoskeletal:        General: Normal range of motion.     Cervical back: Normal range of motion.  Neurological:     General: No focal  deficit present.     Mental Status: She is alert and oriented to person, place, and time.  Psychiatric:        Mood and Affect: Mood and affect and mood normal.     ED Results / Procedures / Treatments   Labs (all labs ordered are listed, but only abnormal results are displayed) Labs Reviewed - No data to display  EKG None  Radiology No results found.  Procedures Procedures (including critical care time)  Medications Ordered in ED Medications - No data to display  ED Course  I have reviewed the triage vital signs and the nursing notes.  Pertinent labs & imaging results that were available during my care of the patient were reviewed by me and considered in my medical decision making (see chart for details).    MDM Rules/Calculators/A&P                          MDM:  Pt  had a negative covid test x 2 this week. Ct head and ct sinus are negative.  Pt given toradol Im to help with headache.  Pt advised to follow up with primary care.  Final Clinical Impression(s) / ED Diagnoses Final diagnoses:  Bad headache    Rx / DC Orders ED Discharge Orders    None    An After Visit Summary was printed and given to the patient.    Osie Cheeks 07/19/20 2246    Lorre Nick, MD 07/22/20 [phone]

## 2020-07-19 NOTE — Discharge Instructions (Addendum)
Your head ct and your sinus ct is normal.  Take your blood pressure medications as prescribed.

## 2020-07-19 NOTE — ED Notes (Signed)
Pt reports history of high blood pressure and states she did not take her medicine today.

## 2020-07-20 ENCOUNTER — Telehealth: Payer: Self-pay | Admitting: *Deleted

## 2020-07-20 NOTE — Telephone Encounter (Signed)
Transition Care Management Unsuccessful Follow-up Telephone Call  Date of discharge and from where:  07/19/20 [facility]   Attempts:  1st Attempt  Reason for unsuccessful TCM follow-up call:  Unable to reach patient

## 2020-07-21 NOTE — Telephone Encounter (Signed)
Transition Care Management Follow-up Telephone Call  Date of discharge and from where: 07/19/2020 from [facility] ED  How have you been since you were released from the hospital? Patient states that she still has a migraine.   Any questions or concerns? No  Items Reviewed:  Did the pt receive and understand the discharge instructions provided? Yes   Medications obtained and verified? Yes   Other? No   Any new allergies since your discharge? No   Dietary orders reviewed? Yes  Do you have support at home? Yes   Functional Questionnaire: (I = Independent and D = Dependent) ADLs: I  Bathing/Dressing- I  Meal Prep- I  Eating- I  Maintaining continence- I  Transferring/Ambulation- I  Managing Meds- I   Follow up appointments reviewed:   PCP Hospital f/u appt confirmed? No  Pt encouraged to call PCP office to see if they have an available appointment for this week.   Are transportation arrangements needed? No   If their condition worsens, is the pt aware to call PCP or go to the Emergency Dept.? Yes  Was the patient provided with contact information for the PCP's office or ED? Yes  Was to pt encouraged to call back with questions or concerns? Yes

## 2020-08-10 ENCOUNTER — Telehealth: Payer: Medicaid Other | Admitting: Internal Medicine

## 2020-08-11 DIAGNOSIS — D539 Nutritional anemia, unspecified: Secondary | ICD-10-CM | POA: Diagnosis not present

## 2020-08-11 DIAGNOSIS — I10 Essential (primary) hypertension: Secondary | ICD-10-CM | POA: Diagnosis not present

## 2020-08-11 DIAGNOSIS — J45909 Unspecified asthma, uncomplicated: Secondary | ICD-10-CM | POA: Diagnosis not present

## 2020-08-11 DIAGNOSIS — Z79899 Other long term (current) drug therapy: Secondary | ICD-10-CM | POA: Diagnosis not present

## 2020-08-11 DIAGNOSIS — E781 Pure hyperglyceridemia: Secondary | ICD-10-CM | POA: Diagnosis not present

## 2020-08-11 DIAGNOSIS — E559 Vitamin D deficiency, unspecified: Secondary | ICD-10-CM | POA: Diagnosis not present

## 2020-08-11 DIAGNOSIS — Z20822 Contact with and (suspected) exposure to covid-19: Secondary | ICD-10-CM | POA: Diagnosis not present

## 2020-08-11 DIAGNOSIS — R600 Localized edema: Secondary | ICD-10-CM | POA: Diagnosis not present

## 2020-08-11 DIAGNOSIS — R5383 Other fatigue: Secondary | ICD-10-CM | POA: Diagnosis not present

## 2020-08-11 DIAGNOSIS — M129 Arthropathy, unspecified: Secondary | ICD-10-CM | POA: Diagnosis not present

## 2020-08-12 ENCOUNTER — Encounter: Payer: Self-pay | Admitting: Internal Medicine

## 2020-10-19 ENCOUNTER — Ambulatory Visit: Payer: Medicaid Other | Admitting: Neurology

## 2020-10-19 ENCOUNTER — Encounter: Payer: Self-pay | Admitting: Neurology

## 2020-10-19 ENCOUNTER — Telehealth: Payer: Self-pay | Admitting: Neurology

## 2020-10-19 ENCOUNTER — Encounter: Payer: Self-pay | Admitting: *Deleted

## 2020-10-19 NOTE — Telephone Encounter (Signed)
Patient no showed. Can be rescheduled with any physician. But please advise that another no show may result in dismissal.

## 2020-12-02 DIAGNOSIS — H5213 Myopia, bilateral: Secondary | ICD-10-CM | POA: Diagnosis not present

## 2021-04-08 ENCOUNTER — Telehealth: Payer: Self-pay | Admitting: Orthopaedic Surgery

## 2021-04-08 NOTE — Telephone Encounter (Signed)
Pt came in to see if she can get a walk in appt. Pt states she has severe pains in right shoulder. Pt is asking for medical advice until upcoming appt 9/22. Please call pt at [phone].

## 2021-04-09 ENCOUNTER — Emergency Department (HOSPITAL_COMMUNITY)
Admission: EM | Admit: 2021-04-09 | Discharge: 2021-04-09 | Disposition: A | Discharge status: Home / Self Care | Payer: BC Managed Care – PPO | Attending: Emergency Medicine | Admitting: Emergency Medicine

## 2021-04-09 ENCOUNTER — Encounter (HOSPITAL_COMMUNITY): Payer: Self-pay | Admitting: Emergency Medicine

## 2021-04-09 ENCOUNTER — Emergency Department (HOSPITAL_COMMUNITY): Payer: BC Managed Care – PPO

## 2021-04-09 DIAGNOSIS — M62838 Other muscle spasm: Secondary | ICD-10-CM | POA: Insufficient documentation

## 2021-04-09 DIAGNOSIS — Z7951 Long term (current) use of inhaled steroids: Secondary | ICD-10-CM | POA: Diagnosis not present

## 2021-04-09 DIAGNOSIS — M25511 Pain in right shoulder: Secondary | ICD-10-CM | POA: Insufficient documentation

## 2021-04-09 DIAGNOSIS — I10 Essential (primary) hypertension: Secondary | ICD-10-CM | POA: Insufficient documentation

## 2021-04-09 DIAGNOSIS — J45909 Unspecified asthma, uncomplicated: Secondary | ICD-10-CM | POA: Diagnosis not present

## 2021-04-09 DIAGNOSIS — Z79899 Other long term (current) drug therapy: Secondary | ICD-10-CM | POA: Insufficient documentation

## 2021-04-09 MED ORDER — LIDOCAINE 5 % EX PTCH: 1.0000 | MEDICATED_PATCH | CUTANEOUS | 0 refills | Status: DC

## 2021-04-09 MED ORDER — CYCLOBENZAPRINE HCL 10 MG PO TABS: 10.0000 mg | ORAL_TABLET | Freq: Two times a day (BID) | ORAL | 0 refills | Status: DC | PRN

## 2021-04-09 NOTE — ED Triage Notes (Signed)
Pt reports 1 week of right shoulder pain without injury. Painful with ROM.

## 2021-04-09 NOTE — Telephone Encounter (Signed)
Please advise 

## 2021-04-09 NOTE — ED Notes (Signed)
Pt has 2+ right radial pulse, cap refill less than 3 sec, war to touch, 5/5 right grip strength. Pt states she has numbness and tingling in right upper arm. Pt has decreased ROM of right shoulder due to pain.

## 2021-04-09 NOTE — Telephone Encounter (Signed)
Tried calling pt to advise but mail box was full

## 2021-04-09 NOTE — Telephone Encounter (Signed)
Is this something that needs to be seen sooner?

## 2021-04-09 NOTE — Discharge Instructions (Signed)
Your history, exam, work-up today are consistent with a likely soft tissue or muscular cause of the right shoulder pain. Your x-ray did not show any fracture dislocation but did show some degenerative and arthritic changes.  The story and symptoms were less consistent with infection or other more emergent etiologies at this time.  We agreed together to have you use a sling to take the pressure off of the shoulder as well as use a muscle relaxant and Lidoderm patches to help with the discomfort and have you have more expedited follow-up with orthopedics for further management.  If you have any change or worsening of symptoms including development of associated symptoms, please return to the nearest emergency department.

## 2021-04-09 NOTE — ED Notes (Signed)
Ortho tech has been called to apply a shoulder immobilizer

## 2021-04-09 NOTE — Progress Notes (Signed)
Orthopedic Tech Progress Note Patient Details:  Patricia Gross [birth year] [id number]  Ortho Devices Type of Ortho Device: Shoulder immobilizer Ortho Device/Splint Location: RUE Ortho Device/Splint Interventions: Ordered, Application, Adjustment   Post Interventions Patient Tolerated: Well Instructions Provided: Adjustment of device, Care of device, Poper ambulation with device  Scout Gumbs 04/09/2021, 7:40 PM

## 2021-04-09 NOTE — ED Provider Notes (Signed)
[facility] EMERGENCY DEPARTMENT Provider Note   CSN: [id number] Arrival date & time: 04/09/21  1429     History Chief Complaint  Patient presents with   Shoulder Pain    Patricia Gross is a 55 y.o. female.  The history is provided by the patient and medical records. No language interpreter was used.  Shoulder Pain Location:  Shoulder Shoulder location:  R shoulder Injury: no   Pain details:    Quality:  Sharp and cramping   Radiates to:  R shoulder   Severity:  Severe   Onset quality:  Gradual   Duration:  1 week   Timing:  Constant   Progression:  Unchanged Handedness:  Right-handed Dislocation: no   Foreign body present:  Unable to specify Tetanus status:  Unknown Prior injury to area:  No Relieved by:  Nothing Worsened by:  Movement Ineffective treatments:  None tried Associated symptoms: no back pain, no decreased range of motion, no fatigue, no fever, no muscle weakness and no neck pain   Risk factors: no known bone disorder and no recent illness       Past Medical History:  Diagnosis Date   Arthritis    Asthma    Chronic pain    Hypertension    Insulin resistance    Vitamin D deficiency     Patient Active Problem List   Diagnosis Date Noted   Morbid obesity due to excess calories (HCC) complicated by HBP/LVH 06/02/2019   DOE (dyspnea on exertion) 05/23/2019   Lumbar pain 08/02/2017   Acute lumbar myofascial strain 08/02/2017   Musculoskeletal pain 08/02/2017   Left lower quadrant pain 07/29/2017   Hematuria 07/29/2017   Fatty liver 07/16/2016   Benign essential HTN 07/16/2016    Past Surgical History:  Procedure Laterality Date   CHOLECYSTECTOMY     GALLBLADDER SURGERY       OB History   No obstetric history on file.     Family History  Problem Relation Age of Onset   Aneurysm Mother    Alcohol abuse Father     Social History   Tobacco Use   Smoking status: Never   Smokeless tobacco: Never  Vaping Use    Vaping Use: Never used  Substance Use Topics   Alcohol use: No   Drug use: No    Home Medications Prior to Admission medications   Medication Sig Start Date End Date Taking? Authorizing Provider  albuterol (VENTOLIN HFA) 108 (90 Base) MCG/ACT inhaler Inhale 1-2 puffs into the lungs every 6 (six) hours as needed for wheezing or shortness of breath. 05/11/19   Liberty Handy, PA-C  bisoprolol (ZEBETA) 5 MG tablet Take 1 tablet (5 mg total) by mouth daily. 05/31/19   Nyoka Cowden, MD  ferrous sulfate 325 (65 FE) MG tablet Take by mouth.    [provider]  montelukast (SINGULAIR) 10 MG tablet Take 10 mg by mouth at bedtime.    [provider]  pravastatin (PRAVACHOL) 20 MG tablet Take 20 mg by mouth daily. 07/16/20   [provider]  spironolactone (ALDACTONE) 25 MG tablet Take 25 mg by mouth daily. 05/29/20   [provider]  Vitamin D, Ergocalciferol, (DRISDOL) 1.25 MG (50000 UNIT) CAPS capsule Take 50,000 Units by mouth once a week. 07/31/20   [provider]    Allergies    Patient has no known allergies.  Review of Systems   Review of Systems  Constitutional:  Negative for chills,  fatigue and fever.  HENT:  Negative for congestion.   Eyes:  Negative for visual disturbance.  Respiratory:  Negative for cough, chest tightness and shortness of breath.   Cardiovascular:  Negative for chest pain.  Gastrointestinal:  Negative for abdominal pain, constipation, diarrhea, nausea and vomiting.  Genitourinary:  Negative for dysuria and flank pain.  Musculoskeletal:  Negative for back pain and neck pain.  Skin:  Negative for rash and wound.  Neurological:  Negative for dizziness, weakness, light-headedness, numbness and headaches.  Psychiatric/Behavioral:  Negative for agitation.   All other systems reviewed and are negative.  Physical Exam Updated Vital Signs BP (!) 159/81 (BP Location: Left Arm)   Pulse 69   Temp 98.5 F (36.9 C)  (Oral)   Resp 16   SpO2 98%   Physical Exam Vitals and nursing note reviewed.  Constitutional:      General: She is not in acute distress.    Appearance: She is well-developed. She is not ill-appearing, toxic-appearing or diaphoretic.  HENT:     Head: Normocephalic and atraumatic.     Nose: Nose normal.     Mouth/Throat:     Mouth: Mucous membranes are moist.     Pharynx: No oropharyngeal exudate or posterior oropharyngeal erythema.  Eyes:     Extraocular Movements: Extraocular movements intact.     Conjunctiva/sclera: Conjunctivae normal.     Pupils: Pupils are equal, round, and reactive to light.  Cardiovascular:     Rate and Rhythm: Normal rate and regular rhythm.     Heart sounds: No murmur heard. Pulmonary:     Effort: Pulmonary effort is normal. No respiratory distress.     Breath sounds: Normal breath sounds. No wheezing, rhonchi or rales.  Chest:     Chest wall: No tenderness.  Abdominal:     General: Abdomen is flat.     Palpations: Abdomen is soft.     Tenderness: There is no abdominal tenderness. There is no guarding or rebound.  Musculoskeletal:        General: Tenderness present. No swelling, deformity or signs of injury.     Right shoulder: Tenderness present. No swelling, deformity, effusion, laceration or crepitus. Normal strength. Normal pulse.     Cervical back: Neck supple.     Right lower leg: No edema.     Left lower leg: No edema.     Comments: Tenderness in the right superior deltoid area.  Muscle spasm palpated.  Pain with muscle engagement and movement but no pain with passive range of motion.  Normal range of motion.  No numbness, tingling, or weakness distally.  No tenderness distally.  Good pulses.  No neck tenderness or chest tenderness.  Lungs clear and chest nontender  Skin:    General: Skin is warm and dry.     Capillary Refill: Capillary refill takes less than 2 seconds.     Findings: No erythema.  Neurological:     General: No focal deficit  present.     Mental Status: She is alert.  Psychiatric:        Mood and Affect: Mood normal.    ED Results / Procedures / Treatments   Labs (all labs ordered are listed, but only abnormal results are displayed) Labs Reviewed - No data to display  EKG None  Radiology DG Shoulder Right  Result Date: 04/09/2021 CLINICAL DATA:  Right shoulder pain. EXAM: RIGHT SHOULDER - 2+ VIEW COMPARISON:  Chest x-ray 05/11/2019. FINDINGS: There is no acute fracture or  dislocation. There is acromioclavicular joint space narrowing and minimal osteophyte formation of the glenohumeral joint compatible with degenerative change. Again seen are soft tissue calcifications adjacent to the greater tuberosity compatible with calcific tendinitis. The soft tissues are otherwise within normal limits. IMPRESSION: 1. No acute fracture or dislocation. 2. Degenerative changes as above. Electronically Signed   By: Darliss CheneyAmy  Guttmann M.D.   On: 04/09/2021 15:32    Procedures Procedures   Medications Ordered in ED Medications - No data to display  ED Course  I have reviewed the triage vital signs and the nursing notes.  Pertinent labs & imaging results that were available during my care of the patient were reviewed by me and considered in my medical decision making (see chart for details).    MDM Rules/Calculators/A&P                           Patricia Gross is a 55 y.o. female with a past medical history significant for hypertension, asthma, chronic pain, arthritis, previous cholecystectomy, and fatty liver who presents with right shoulder pain.  She reports that for the last week she has had pain in her right shoulder that is worsened with movement.  She reports passively it does not hurt as much when it is moved but more when she engages the muscles and moves it.  She think she feels some muscle spasms as well.  She denies rashes, fevers, chills, or other infectious symptoms.  She denies any numbness, tingling, or weakness  of the extremities.  She reports occasional pain in her hand but denies any trauma otherwise.  Is not having pain in the and now.  Denies any radiation of the pain from the shoulder down.  No elbow pain.  No swelling reported.  No trauma.  No overlying warmth or redness.  No recent skin injuries or other infections reported.  There was no other chest pain or back pain.  On exam, patient does have tenderness in the right shoulder area.  There is no crepitance but there is some muscle spasm palpated.  Patient has no rash to suggest shingles and no other warmth or redness to suggest a cellulitis or deeper infection.  No swelling of the arm.  She has intact radial and ulnar pulse and has intact grip strength and sensation.  No snuffbox tenderness or other distal arm tenderness.  Lungs clear and chest nontender.  Clavicle nontender.  Back nontender.  Patient otherwise well-appearing.  Patient is right-handed and was able to use her right arm but it hurt when she moves her shoulder.  Clinically I suspect she has a likely soft tissue or muscular cause of her discomfort.  With the lack of pain with passive movement lower suspicion for a septic arthritis or other infectious cause of symptoms.  Without swelling, low suspicion for a thrombotic cause of her pain in the shoulder.  The pain was only on the superior part of the shoulder right at the head of the humerus.  Patient had an x-ray which showed no fracture or dislocation.  They did show evidence of degenerative changes and arthritis.  We discussed the findings and her exam and we suspect she is stable for discharge home.  We will have her use a sling to take the pressure off the arm and use both Lidoderm patches and a muscle relaxant and have her follow-up with orthopedics.  We will give her several other orthopedic offices to see who can  see her sooner.  Patient agrees with this plan and understand return precautions.  Otherwise she had no other concerning  findings for a thoracic cause of her symptoms.  Patient discharged in good condition with understanding and agreement of plan of care.    Final Clinical Impression(s) / ED Diagnoses Final diagnoses:  Acute pain of right shoulder  Muscle spasm    Rx / DC Orders ED Discharge Orders          Ordered    lidocaine (LIDODERM) 5 %  Every 24 hours        04/09/21 1903    cyclobenzaprine (FLEXERIL) 10 MG tablet  2 times daily PRN        04/09/21 1903           Clinical Impression: 1. Acute pain of right shoulder   2. Muscle spasm     Disposition: Discharge  Condition: Good  I have discussed the results, Dx and Tx plan with the pt(& family if present). He/she/they expressed understanding and agree(s) with the plan. Discharge instructions discussed at great length. Strict return precautions discussed and pt &/or family have verbalized understanding of the instructions. No further questions at time of discharge.    New Prescriptions   CYCLOBENZAPRINE (FLEXERIL) 10 MG TABLET    Take 1 tablet (10 mg total) by mouth 2 (two) times daily as needed for muscle spasms.   LIDOCAINE (LIDODERM) 5 %    Place 1 patch onto the skin daily. Remove & Discard patch within 12 hours or as directed by MD    Follow Up: [address] [place] [address] [phone]     Specialists, [facility] [address] [place] [address] [phone]     [facility] EMERGENCY DEPARTMENT [address] [id number] mc [place] [address] [phone]       Sahana Boyland, Canary Brim, MD 04/09/21 [phone]

## 2021-04-12 ENCOUNTER — Telehealth: Payer: Self-pay

## 2021-04-12 NOTE — Telephone Encounter (Signed)
Transition Care Management Follow-up Telephone Call Date of discharge and from where: 04/09/2021-[facility] How have you been since you were released from the hospital? Patient stated she is doing better.  Any questions or concerns? No  Items Reviewed: Did the pt receive and understand the discharge instructions provided? Yes  Medications obtained and verified?  No medications given at discharge per patient.  Other? No  Any new allergies since your discharge? No  Dietary orders reviewed? N/A Do you have support at home?  No support need per patient.   Home Care and Equipment/Supplies: Were home health services ordered? not applicable If so, what is the name of the agency? N/A  Has the agency set up a time to come to the patient's home? not applicable Were any new equipment or medical supplies ordered?  No What is the name of the medical supply agency? N/A Were you able to get the supplies/equipment? not applicable Do you have any questions related to the use of the equipment or supplies? No  Functional Questionnaire: (I = Independent and D = Dependent) ADLs: I  Bathing/Dressing- I  Meal Prep- I  Eating- I  Maintaining continence- I  Transferring/Ambulation- I  Managing Meds- I  Follow up appointments reviewed:  PCP Hospital f/u appt confirmed? No   Specialist Hospital f/u appt confirmed? Yes  Scheduled to see Richardean Canal, PA-C on 04/22/2021 @ 3PM. Are transportation arrangements needed? No  If their condition worsens, is the pt aware to call PCP or go to the Emergency Dept.? Yes Was the patient provided with contact information for the PCP's office or ED? Yes Was to pt encouraged to call back with questions or concerns? Yes

## 2021-04-22 ENCOUNTER — Ambulatory Visit: Payer: Medicaid Other | Admitting: Physician Assistant

## 2021-05-03 ENCOUNTER — Ambulatory Visit: Payer: Medicaid Other | Admitting: Physician Assistant

## 2021-09-12 IMAGING — CT CT MAXILLOFACIAL W/O CM
3 series · 16 of 47 positions shown, 19 images · non-contrast
Comparison: The patient's CT head from 3522 could not be retrieved
for comparison.

CLINICAL DATA: Pain behind eyes.

EXAM:
CT HEAD WITHOUT CONTRAST
CT MAXILLOFACIAL WITHOUT CONTRAST
TECHNIQUE: Multidetector CT imaging of the head and maxillofacial structures
were performed using the standard protocol without intravenous
contrast. Multiplanar CT image reconstructions of the maxillofacial
structures were also generated.

[Series 3: max soft · axial · 0.37mm/px · z∈[-269,-127]mm · 10 of 83 slices shown, 13 images]
[im 6/83  brain]
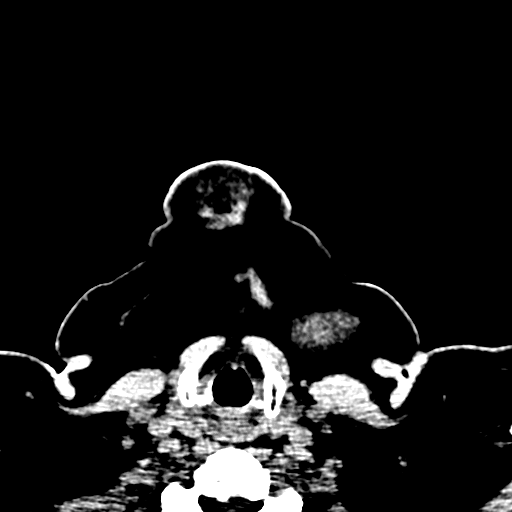
[im 6/83  bone]
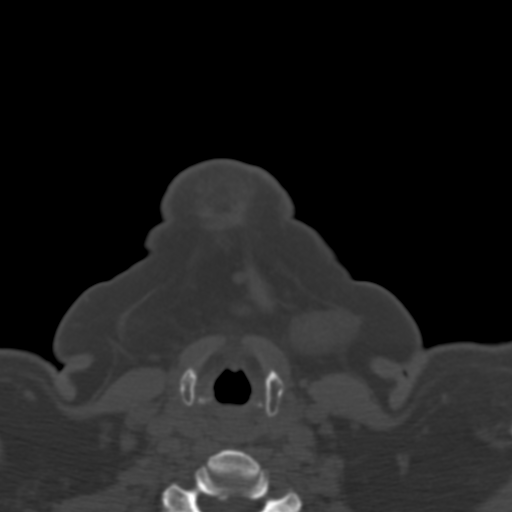
[im 15/83  bone]
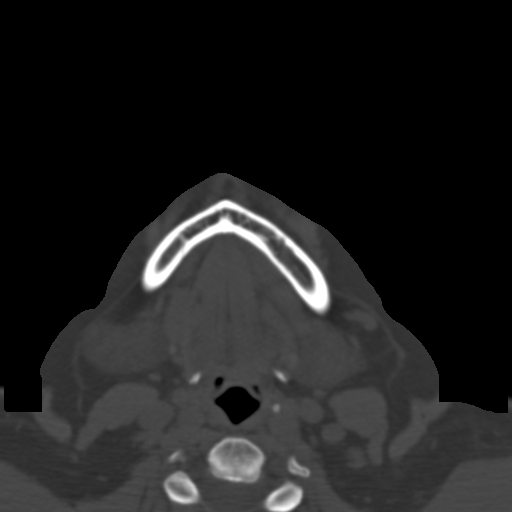
[im 23/83  bone]
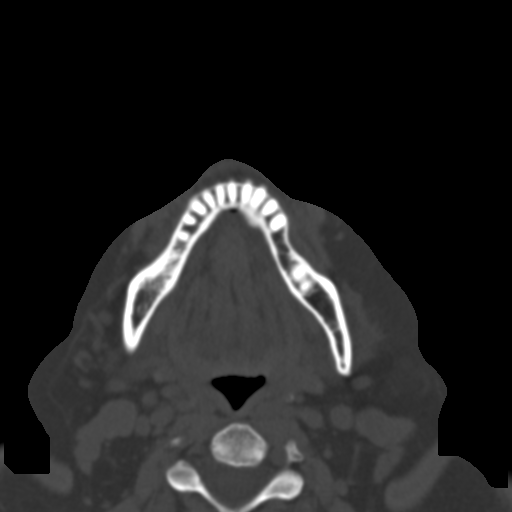
[im 29/83  bone]
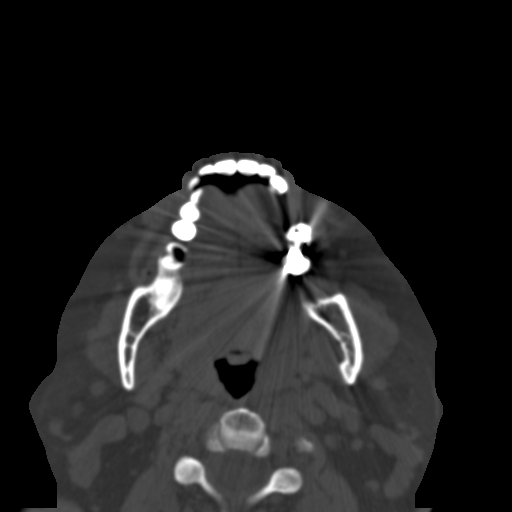
[im 37/83  brain]
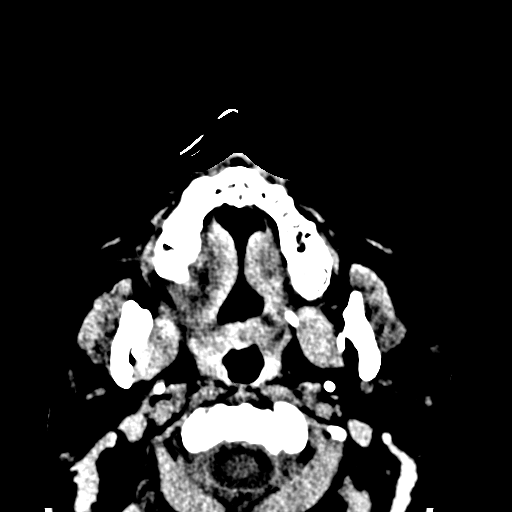
[im 37/83  bone]
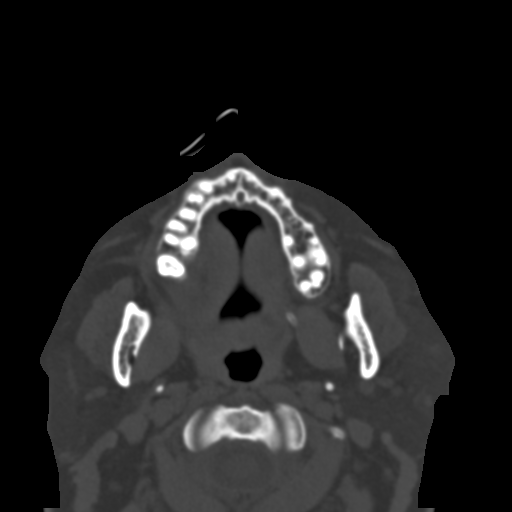
[im 46/83  bone]
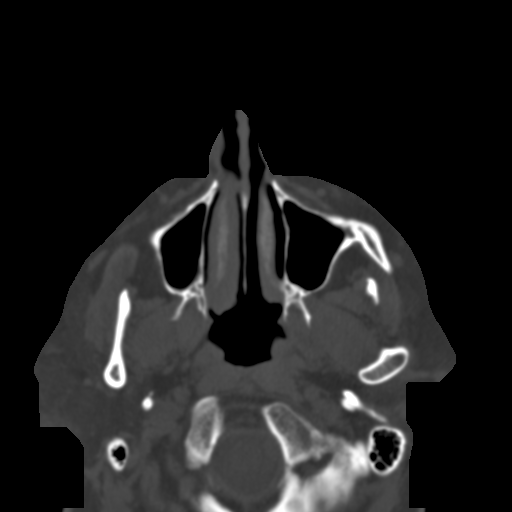
[im 54/83  bone]
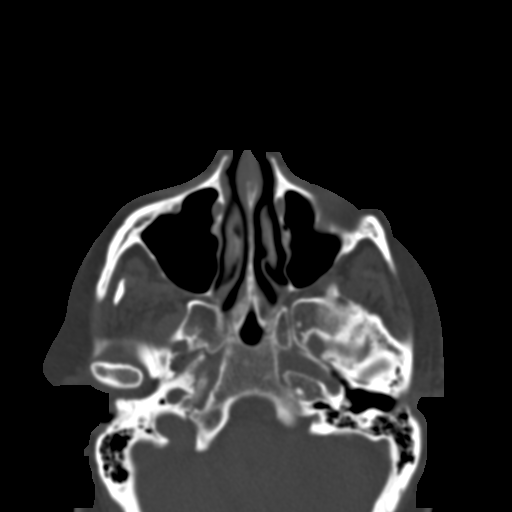
[im 63/83  bone]
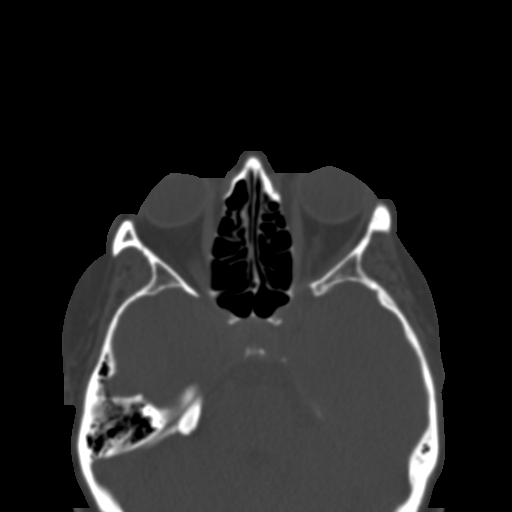
[im 68/83  brain]
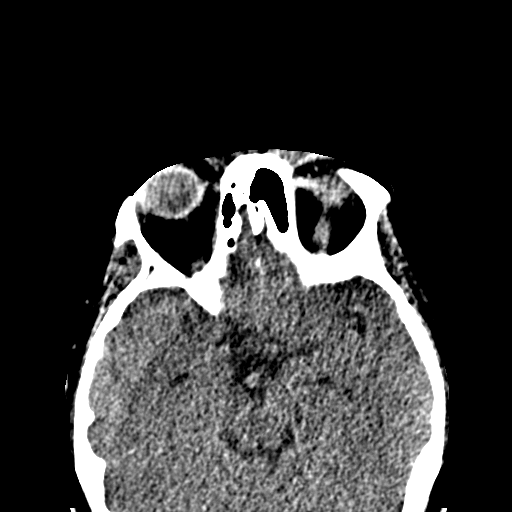
[im 68/83  bone]
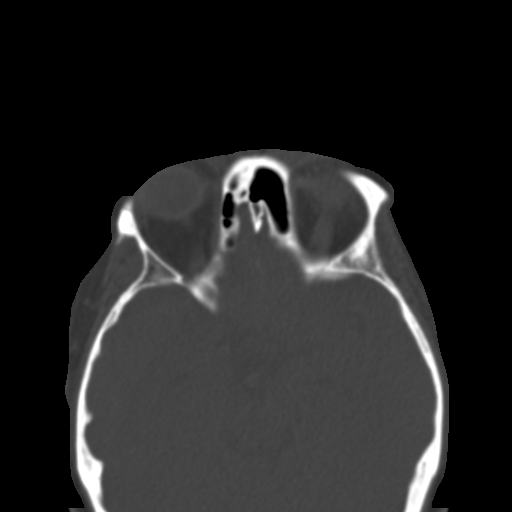
[im 77/83  bone]
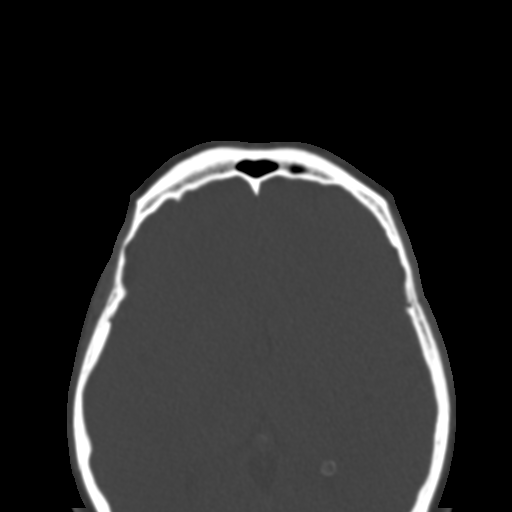

[Series 7: coronal soft · coronal · 0.36mm/px · 3 of 80 slices shown]
[im 27/80  bone]
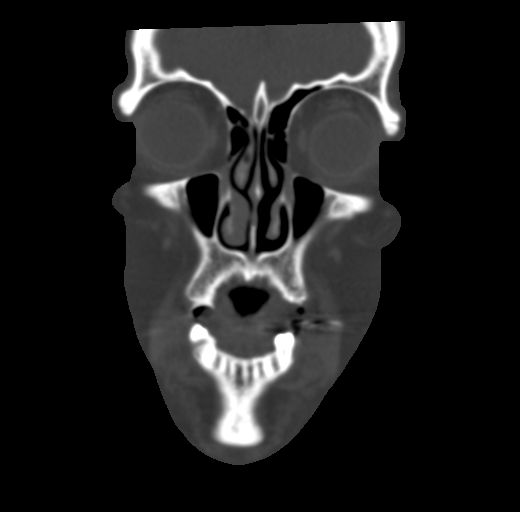
[im 36/80  bone]
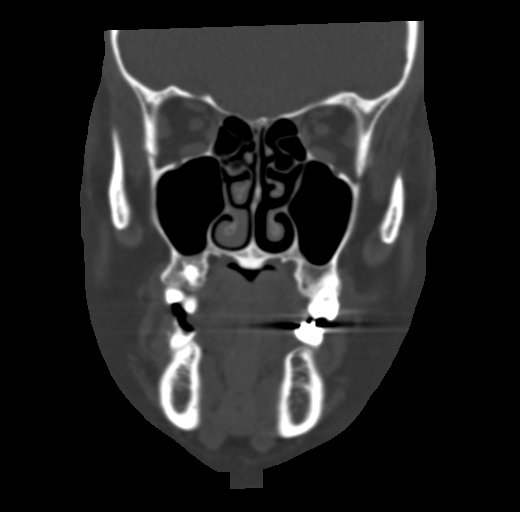
[im 44/80  bone]
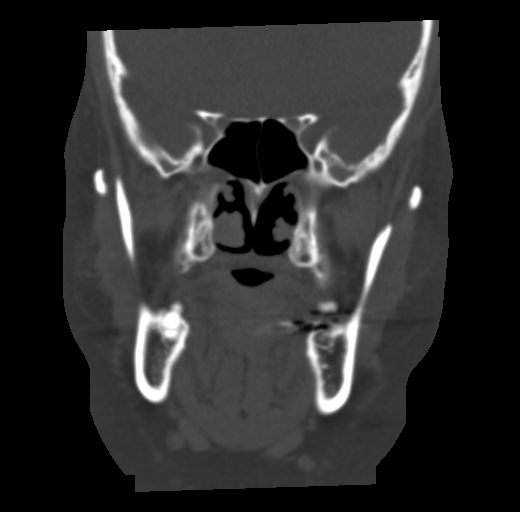

[Series 8: sagittal soft · sagittal · 0.31mm/px · 3 of 94 slices shown]
[im 32/94  bone]
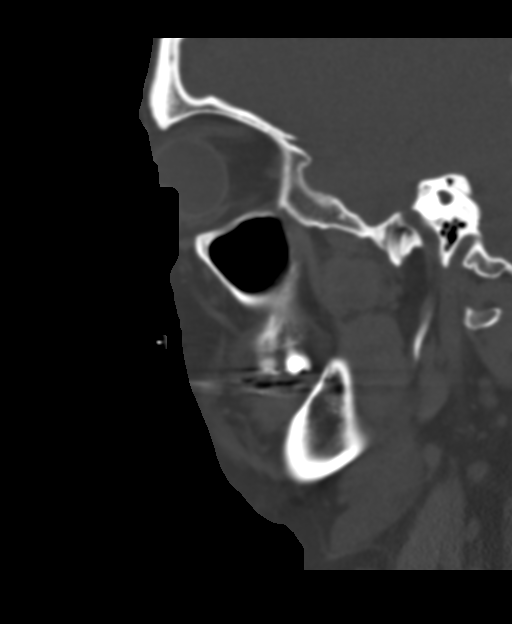
[im 47/94  bone]
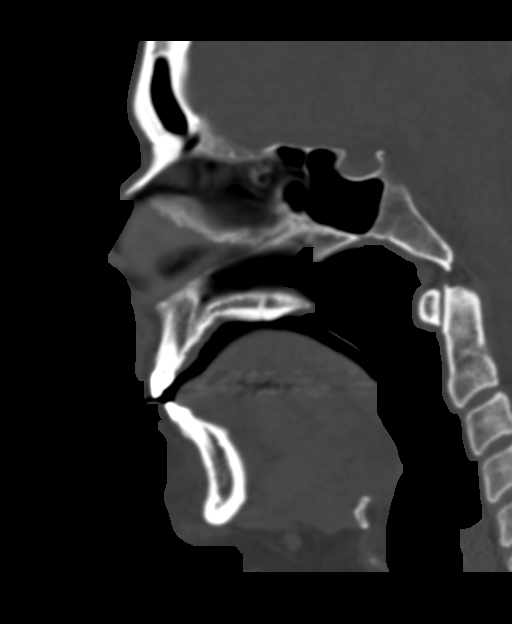
[im 63/94  bone]
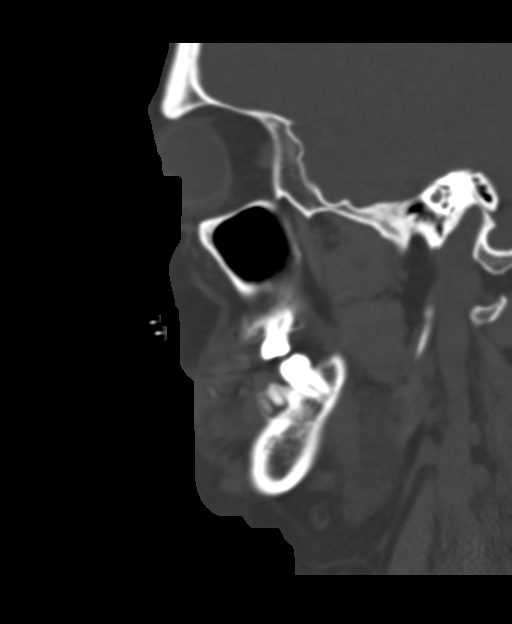

[16 of 47 positions shown; findings below may reference images not displayed]

FINDINGS: CT HEAD FINDINGS

Brain: No evidence of acute infarction, hemorrhage, hydrocephalus,
extra-axial collection or mass lesion/mass effect.

Vascular: No hyperdense vessel or unexpected calcification.

Skull: Normal. Negative for fracture or focal lesion.

Other: None.

CT MAXILLOFACIAL FINDINGS

Osseous: No fracture or mandibular dislocation. No destructive
process.

Orbits: Negative. No traumatic or inflammatory finding.

Sinuses: Clear.

Soft tissues: Negative. A dental Tiger and periapical lucency is
noted involving one of the molars of the right mandible.
IMPRESSION: 1. No acute intracranial abnormality.
2. No acute facial fracture.  The sinuses are essentially clear.

## 2021-10-12 ENCOUNTER — Other Ambulatory Visit: Payer: Self-pay | Admitting: Endocrinology

## 2021-10-12 DIAGNOSIS — Z1231 Encounter for screening mammogram for malignant neoplasm of breast: Secondary | ICD-10-CM

## 2021-11-30 ENCOUNTER — Encounter: Payer: Self-pay | Admitting: Orthopedic Surgery

## 2021-11-30 ENCOUNTER — Ambulatory Visit (INDEPENDENT_AMBULATORY_CARE_PROVIDER_SITE_OTHER): Payer: Medicaid Other

## 2021-11-30 ENCOUNTER — Ambulatory Visit (INDEPENDENT_AMBULATORY_CARE_PROVIDER_SITE_OTHER): Payer: Medicaid Other | Admitting: Orthopedic Surgery

## 2021-11-30 DIAGNOSIS — M25562 Pain in left knee: Secondary | ICD-10-CM

## 2021-11-30 NOTE — Progress Notes (Signed)
? ?Office Visit Note ?  ?Patient: Patricia Gross           ?Date of Birth: 04/05/1966           ?MRN: [id number] ?Visit Date: 11/30/2021 ?             ?Requested by: Courtney Paris, NP ?[address] ?[place],  [place] [address] ?PCP: Courtney Paris, NP ? ?Chief Complaint  ?Patient presents with  ? Left Knee - Pain  ? ? ? ? ?HPI: ?Patient is a 56 year old woman who states that she struck her left knee on the corner of a chair.  She complains of swelling and pain. ? ?Assessment & Plan: ?Visit Diagnoses:  ?1. Acute pain of left knee   ? ? ?Plan: Recommended ice elevation and Voltaren gel 3 times a day.  Patient was given a note to be out of work for 2 days. ? ?Follow-Up Instructions: Return in about 2 weeks (around 12/14/2021).  ? ?Ortho Exam ? ?Patient is alert, oriented, no adenopathy, well-dressed, normal affect, normal respiratory effort. ?Examination patient has an antalgic gait she has a mild effusion.  The patella tracks midline she has full active extension with no extensor lag.  Extensor mechanism is intact.  Varus and valgus stresses stable she does have some tenderness to palpation of the medial joint line.  There is no skin breakdowns no bruising. ? ?Imaging: ?XR Knee 1-2 Views Left ? ?Result Date: 11/30/2021 ?2 view radiographs of the left knee shows no bony abnormalities no fractures.  ?No images are attached to the encounter. ? ?Labs: ?No results found for: HGBA1C, ESRSEDRATE, CRP, LABURIC, REPTSTATUS, GRAMSTAIN, CULT, LABORGA ? ? ?Lab Results  ?Component Value Date  ? ALBUMIN 4.9 06/16/2017  ? ALBUMIN 4.0 03/04/2014  ? ? ?Lab Results  ?Component Value Date  ? MG 2.3 05/11/2019  ? ?No results found for: VD25OH ? ?No results found for: PREALBUMIN ? ?  Latest Ref Rng & Units 05/23/2019  ? 10:49 AM 05/11/2019  ?  5:45 PM 07/29/2017  ? 10:47 AM  ?CBC EXTENDED  ?WBC 4.0 - 10.5 K/uL 5.9   6.8   5.8    ?RBC 3.87 - 5.11 Mil/uL 4.92   4.98   4.76    ?Hemoglobin 12.0 - 15.0 g/dL 85.8   85.0   27.7    ?HCT 36.0 - 46.0 %  42.1   44.4   41.0    ?Platelets 150.0 - 400.0 K/uL 186.0   212     ?NEUT# 1.4 - 7.7 K/uL 2.9      ?Lymph# 0.7 - 4.0 K/uL 2.3      ? ? ? ?There is no height or weight on file to calculate BMI. ? ?Orders:  ?Orders Placed This Encounter  ?Procedures  ? XR Knee 1-2 Views Left  ? ?No orders of the defined types were placed in this encounter. ? ? ? Procedures: ?No procedures performed ? ?Clinical Data: ?No additional findings. ? ?ROS: ? ?All other systems negative, except as noted in the HPI. ?Review of Systems ? ?Objective: ?Vital Signs: There were no vitals taken for this visit. ? ?Specialty Comments:  ?No specialty comments available. ? ?PMFS History: ?Patient Active Problem List  ? Diagnosis Date Noted  ? Morbid obesity due to excess calories (HCC) complicated by HBP/LVH 06/02/2019  ? DOE (dyspnea on exertion) 05/23/2019  ? Lumbar pain 08/02/2017  ? Acute lumbar myofascial strain 08/02/2017  ? Musculoskeletal pain 08/02/2017  ? Left lower quadrant pain  07/29/2017  ? Hematuria 07/29/2017  ? Fatty liver 07/16/2016  ? Benign essential HTN 07/16/2016  ? ?Past Medical History:  ?Diagnosis Date  ? Arthritis   ? Asthma   ? Chronic pain   ? Hypertension   ? Insulin resistance   ? Vitamin D deficiency   ?  ?Family History  ?Problem Relation Age of Onset  ? Aneurysm Mother   ? Alcohol abuse Father   ?  ?Past Surgical History:  ?Procedure Laterality Date  ? CHOLECYSTECTOMY    ? GALLBLADDER SURGERY    ? ?Social History  ? ?Occupational History  ? Occupation: cuts vegetables  ?Tobacco Use  ? Smoking status: Never  ? Smokeless tobacco: Never  ?Vaping Use  ? Vaping Use: Never used  ?Substance and Sexual Activity  ? Alcohol use: No  ? Drug use: No  ? Sexual activity: Not on file  ? ? ? ? ? ?

## 2021-12-03 ENCOUNTER — Telehealth: Payer: Self-pay | Admitting: Orthopedic Surgery

## 2021-12-03 NOTE — Telephone Encounter (Signed)
Call pt for more details. No answer and VM box is full. ?

## 2021-12-03 NOTE — Telephone Encounter (Signed)
Patient states she need a new out of work note extending her days out for 2 more days. She was told by Dr Lajoyce Corners of she needed more days he would give them to her ?

## 2021-12-08 NOTE — Telephone Encounter (Signed)
Called pt again, no answer and VM box is still full. ?

## 2021-12-14 ENCOUNTER — Ambulatory Visit: Payer: Medicaid Other | Admitting: Orthopedic Surgery

## 2022-02-04 DIAGNOSIS — J4520 Mild intermittent asthma, uncomplicated: Secondary | ICD-10-CM | POA: Insufficient documentation

## 2022-03-09 ENCOUNTER — Encounter (INDEPENDENT_AMBULATORY_CARE_PROVIDER_SITE_OTHER): Payer: Self-pay

## 2022-04-18 DIAGNOSIS — I83892 Varicose veins of left lower extremities with other complications: Secondary | ICD-10-CM | POA: Diagnosis not present

## 2022-04-18 DIAGNOSIS — R0683 Snoring: Secondary | ICD-10-CM | POA: Diagnosis not present

## 2022-04-18 DIAGNOSIS — R7303 Prediabetes: Secondary | ICD-10-CM | POA: Diagnosis not present

## 2022-06-03 IMAGING — CR DG SHOULDER 2+V*R*
3 series · 3 of 3 positions shown · non-contrast
Comparison: Chest x-ray 05/11/2019.

CLINICAL DATA: Right shoulder pain.

EXAM:
RIGHT SHOULDER - 2+ VIEW

[shoulder grashey]
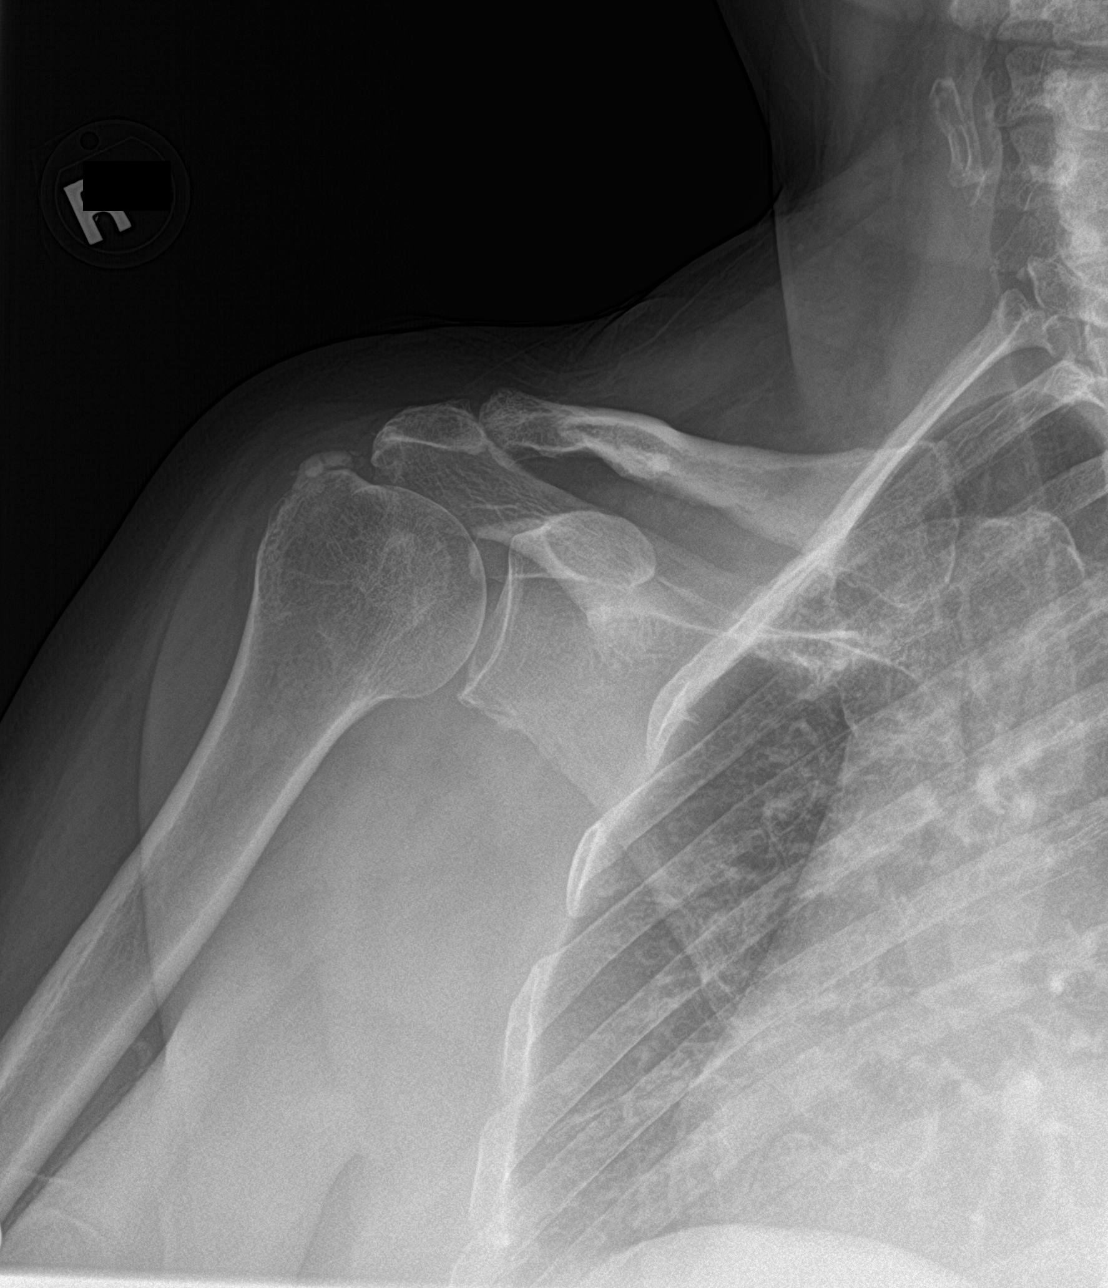

[shoulder axillary]
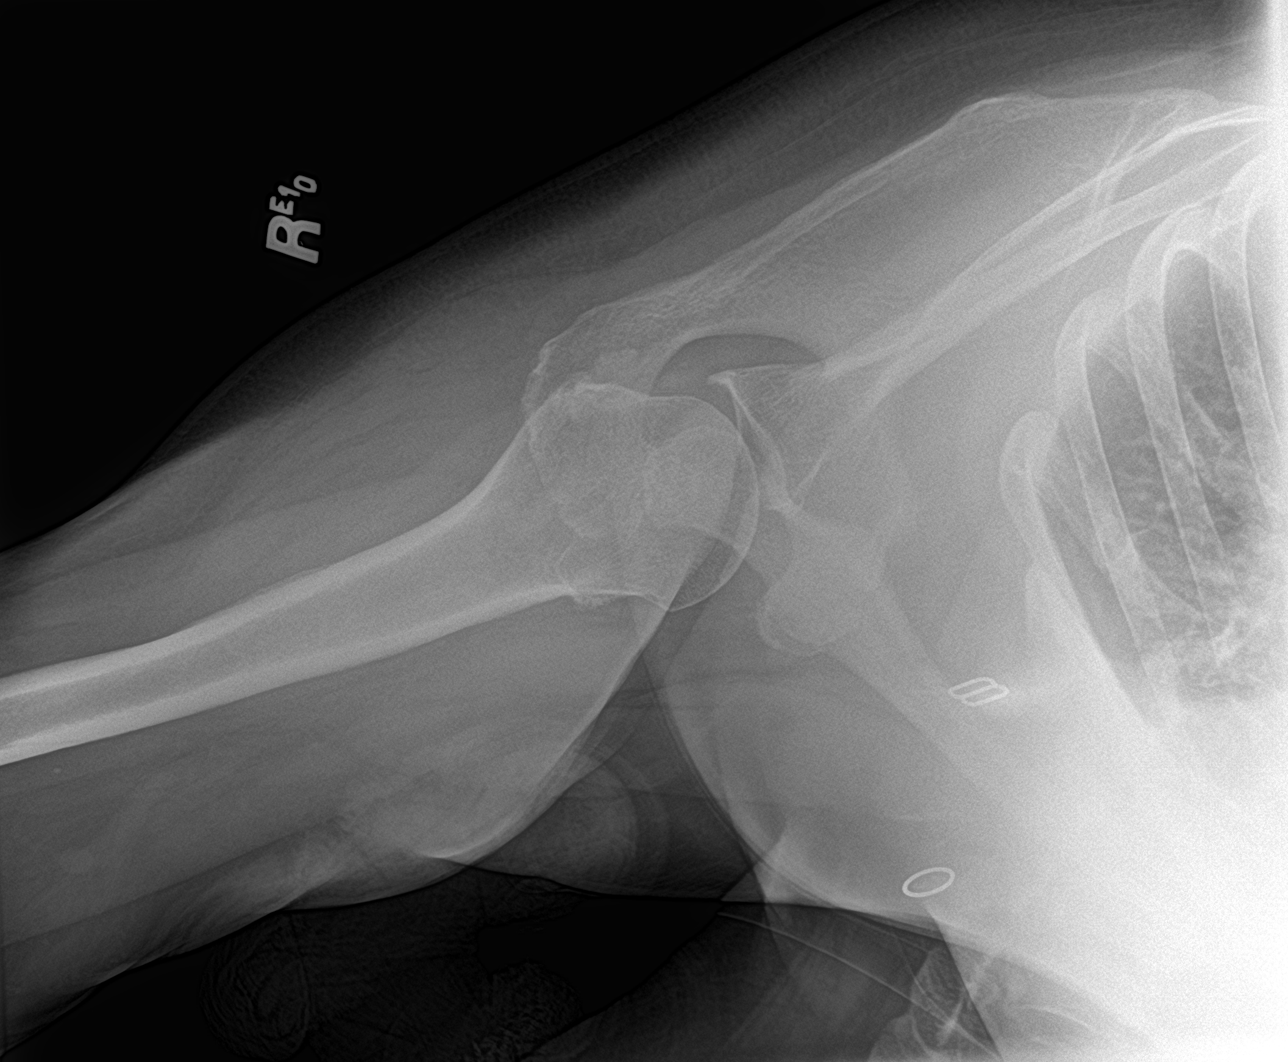

[shoulder y view]
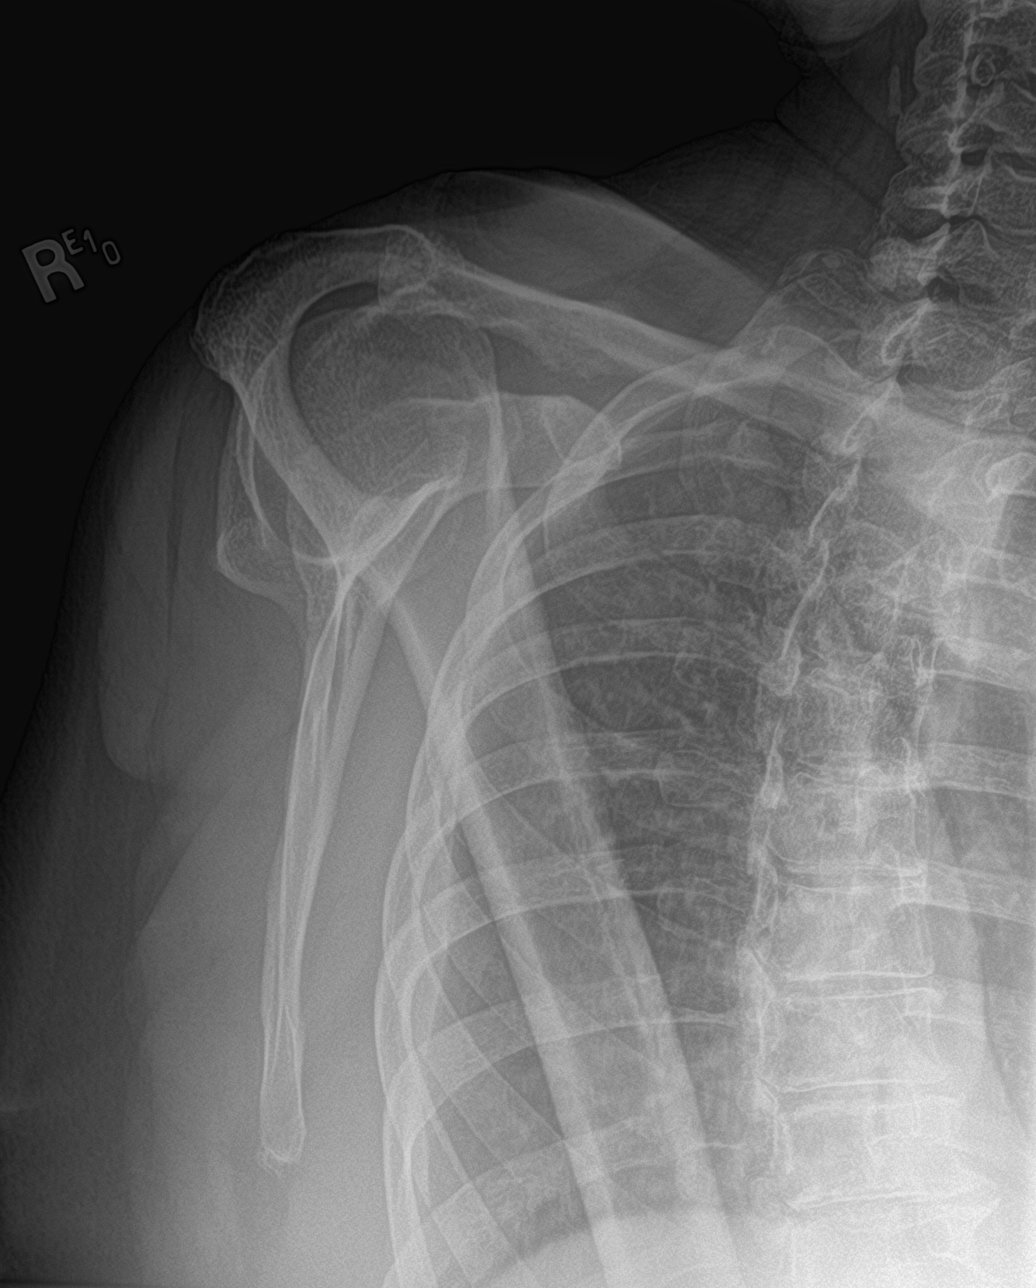

[3 of 3 positions shown; findings below may reference images not displayed]

FINDINGS: There is no acute fracture or dislocation. There is
acromioclavicular joint space narrowing and minimal osteophyte
formation of the glenohumeral joint compatible with degenerative
change. Again seen are soft tissue calcifications adjacent to the
greater tuberosity compatible with calcific tendinitis. The soft
tissues are otherwise within normal limits.
IMPRESSION: 1. No acute fracture or dislocation.
2. Degenerative changes as above.

## 2022-08-04 DIAGNOSIS — R509 Fever, unspecified: Secondary | ICD-10-CM | POA: Diagnosis not present

## 2022-08-04 DIAGNOSIS — R059 Cough, unspecified: Secondary | ICD-10-CM | POA: Diagnosis not present

## 2022-08-04 DIAGNOSIS — U071 COVID-19: Secondary | ICD-10-CM | POA: Diagnosis not present

## 2022-10-04 ENCOUNTER — Other Ambulatory Visit: Payer: Self-pay

## 2022-10-04 ENCOUNTER — Emergency Department (HOSPITAL_COMMUNITY): Payer: Medicaid Other

## 2022-10-04 ENCOUNTER — Emergency Department (HOSPITAL_COMMUNITY)
Admission: EM | Admit: 2022-10-04 | Discharge: 2022-10-04 | Disposition: A | Discharge status: Home / Self Care | Payer: Medicaid Other | Attending: Emergency Medicine | Admitting: Emergency Medicine

## 2022-10-04 ENCOUNTER — Encounter (HOSPITAL_COMMUNITY): Payer: Self-pay

## 2022-10-04 DIAGNOSIS — M545 Low back pain, unspecified: Secondary | ICD-10-CM | POA: Diagnosis not present

## 2022-10-04 DIAGNOSIS — J45909 Unspecified asthma, uncomplicated: Secondary | ICD-10-CM | POA: Diagnosis not present

## 2022-10-04 DIAGNOSIS — I10 Essential (primary) hypertension: Secondary | ICD-10-CM | POA: Diagnosis not present

## 2022-10-04 DIAGNOSIS — Z79899 Other long term (current) drug therapy: Secondary | ICD-10-CM | POA: Diagnosis not present

## 2022-10-04 DIAGNOSIS — R1031 Right lower quadrant pain: Secondary | ICD-10-CM | POA: Insufficient documentation

## 2022-10-04 DIAGNOSIS — R109 Unspecified abdominal pain: Secondary | ICD-10-CM | POA: Diagnosis present

## 2022-10-04 DIAGNOSIS — R1033 Periumbilical pain: Secondary | ICD-10-CM | POA: Diagnosis not present

## 2022-10-04 DIAGNOSIS — R1011 Right upper quadrant pain: Secondary | ICD-10-CM | POA: Insufficient documentation

## 2022-10-04 LAB — CBC WITH DIFFERENTIAL/PLATELET
Abs Immature Granulocytes: 0.01 10*3/uL (ref 0.00–0.07)
Basophils Absolute: 0 10*3/uL (ref 0.0–0.1)
Basophils Relative: 1 %
Eosinophils Absolute: 0.4 10*3/uL (ref 0.0–0.5)
Eosinophils Relative: 6 %
HCT: 42.6 % (ref 36.0–46.0)
Hemoglobin: 13.7 g/dL (ref 12.0–15.0)
Immature Granulocytes: 0 %
Lymphocytes Relative: 36 %
Lymphs Abs: 2.2 10*3/uL (ref 0.7–4.0)
MCH: 28 pg (ref 26.0–34.0)
MCHC: 32.2 g/dL (ref 30.0–36.0)
MCV: 86.9 fL (ref 80.0–100.0)
Monocytes Absolute: 0.3 10*3/uL (ref 0.1–1.0)
Monocytes Relative: 6 %
Neutro Abs: 3.1 10*3/uL (ref 1.7–7.7)
Neutrophils Relative %: 51 %
Platelets: 213 10*3/uL (ref 150–400)
RBC: 4.9 MIL/uL (ref 3.87–5.11)
RDW: 13.3 % (ref 11.5–15.5)
WBC: 6.1 10*3/uL (ref 4.0–10.5)
nRBC: 0 % (ref 0.0–0.2)

## 2022-10-04 LAB — COMPREHENSIVE METABOLIC PANEL
ALT: 37 U/L (ref 0–44)
AST: 31 U/L (ref 15–41)
Albumin: 4 g/dL (ref 3.5–5.0)
Alkaline Phosphatase: 56 U/L (ref 38–126)
Anion gap: 14 (ref 5–15)
BUN: 12 mg/dL (ref 6–20)
CO2: 19 mmol/L — ABNORMAL LOW (ref 22–32)
Calcium: 9.1 mg/dL (ref 8.9–10.3)
Chloride: 107 mmol/L (ref 98–111)
Creatinine, Ser: 0.7 mg/dL (ref 0.44–1.00)
GFR, Estimated: >60 mL/min (ref >60)
Glucose, Bld: 110 mg/dL — ABNORMAL HIGH (ref 70–99)
Potassium: 3.9 mmol/L (ref 3.5–5.1)
Sodium: 140 mmol/L (ref 135–145)
Total Bilirubin: 0.6 mg/dL (ref 0.3–1.2)
Total Protein: 7 g/dL (ref 6.5–8.1)

## 2022-10-04 LAB — URINALYSIS, ROUTINE W REFLEX MICROSCOPIC
Bilirubin Urine: NEGATIVE
Glucose, UA: NEGATIVE mg/dL
Hgb urine dipstick: NEGATIVE
Ketones, ur: NEGATIVE mg/dL
Leukocytes,Ua: NEGATIVE
Nitrite: NEGATIVE
Protein, ur: NEGATIVE mg/dL
Specific Gravity, Urine: 1.01 (ref 1.005–1.030)
pH: 6 (ref 5.0–8.0)

## 2022-10-04 LAB — PREGNANCY, URINE: Preg Test, Ur: NEGATIVE

## 2022-10-04 LAB — LIPASE, BLOOD: Lipase: 43 U/L (ref 11–51)

## 2022-10-04 MED ORDER — NAPROXEN 375 MG PO TABS: 375.0000 mg | ORAL_TABLET | Freq: Two times a day (BID) | ORAL | 0 refills | Status: DC

## 2022-10-04 MED ORDER — DEXAMETHASONE SODIUM PHOSPHATE 10 MG/ML IJ SOLN
8.0000 mg | Freq: Once | INTRAMUSCULAR | Status: AC
  Administered 2022-10-04: 8 mg via INTRAMUSCULAR

## 2022-10-04 MED ORDER — KETOROLAC TROMETHAMINE 60 MG/2ML IM SOLN
60.0000 mg | Freq: Once | INTRAMUSCULAR | Status: AC
  Administered 2022-10-04: 60 mg via INTRAMUSCULAR
  Filled 2022-10-04: qty 2

## 2022-10-04 MED ORDER — KETOROLAC TROMETHAMINE 30 MG/ML IJ SOLN
30.0000 mg | Freq: Once | INTRAMUSCULAR | Status: DC
  Filled 2022-10-04: qty 1

## 2022-10-04 MED ORDER — DEXAMETHASONE SODIUM PHOSPHATE 10 MG/ML IJ SOLN
8.0000 mg | Freq: Once | INTRAMUSCULAR | Status: DC
  Filled 2022-10-04: qty 1

## 2022-10-04 MED ORDER — METHOCARBAMOL 500 MG PO TABS: 500.0000 mg | ORAL_TABLET | Freq: Three times a day (TID) | ORAL | 0 refills | Status: DC | PRN

## 2022-10-04 MED ORDER — BISOPROLOL FUMARATE 5 MG PO TABS: 5.0000 mg | ORAL_TABLET | Freq: Every day | ORAL | 0 refills | Status: AC

## 2022-10-04 MED ORDER — SPIRONOLACTONE 25 MG PO TABS: 25.0000 mg | ORAL_TABLET | Freq: Every day | ORAL | 0 refills | Status: DC

## 2022-10-04 NOTE — ED Notes (Signed)
Urinated prior to triage. Unable to collect sample.

## 2022-10-04 NOTE — ED Triage Notes (Signed)
Right sided abdominal pain and distention beginning last night. Became harder to breath.   Hx fatty liver.   Denies n/v/d.

## 2022-10-04 NOTE — ED Provider Notes (Signed)
Sign out from [facility] Patient here for ER flank pain and urinary frequency for 24 hours, Wainting for urine and renal stone study tenderness..   I reviewed the patients CT renal stone study was negative. I interpreted the results of CT renal stone study which showed no acute findings.  Patient on reexamination has right lower lumbar region tenderness and I suspect likely recurrence of previous diagnosis of lumbago.  Her CT scan does show some are still arthritis.  Patient given pain medication and a steroid here will discharge with anti-inflammatories and muscle relaxers as well as stretches and exercises.  Suspect this is musculoskeletal low back pain.  She appears otherwise appropriate for discharge at this time without red flag symptoms.  Patient does report a history of uterine prolapse and she thinks that this may be causing her to have urinary frequency.     Margarita Mail, PA-C 10/04/22 1634    Pattricia Boss, MD 10/05/22 [phone]

## 2022-10-04 NOTE — ED Provider Notes (Signed)
[facility] Provider Note   CSN: [id number] Arrival date & time: 10/04/22  [id number]     History  Chief Complaint  Patient presents with   Abdominal Pain    Patricia Gross is a 57 y.o. female who presents with 24 hours of right flank pain that radiates down the lower right lower abdomen with urinary frequency and urgency without hematuria or dysuria.  No fevers or chills nausea vomiting or diarrhea.  No history of of nephrolithiasis.  History of cholecystectomy in the past per patient.  I personally reviewed her medical records history of hypertension, insulin resistance, asthma, fatty liver.  She is not anticoagulated and is poorly compliant with her antihypertensive medications.  HPI     Home Medications Prior to Admission medications   Medication Sig Start Date End Date Taking? Authorizing Provider  albuterol (VENTOLIN HFA) 108 (90 Base) MCG/ACT inhaler Inhale 1-2 puffs into the lungs every 6 (six) hours as needed for wheezing or shortness of breath. 05/11/19   Kinnie Feil, PA-C  bisoprolol (ZEBETA) 5 MG tablet Take 1 tablet (5 mg total) by mouth daily. 05/31/19   Tanda Rockers, MD  cyclobenzaprine (FLEXERIL) 10 MG tablet Take 1 tablet (10 mg total) by mouth 2 (two) times daily as needed for muscle spasms. 04/09/21   Tegeler, Gwenyth Allegra, MD  ferrous sulfate 325 (65 FE) MG tablet Take by mouth.    [provider]  lidocaine (LIDODERM) 5 % Place 1 patch onto the skin daily. Remove & Discard patch within 12 hours or as directed by MD 04/09/21   Tegeler, Gwenyth Allegra, MD  montelukast (SINGULAIR) 10 MG tablet Take 10 mg by mouth at bedtime.    [provider]  pravastatin (PRAVACHOL) 20 MG tablet Take 20 mg by mouth daily. 07/16/20   [provider]  spironolactone (ALDACTONE) 25 MG tablet Take 25 mg by mouth daily. 05/29/20   [provider]  Vitamin D, Ergocalciferol, (DRISDOL) 1.25 MG (50000 UNIT)  CAPS capsule Take 50,000 Units by mouth once a week. 07/31/20   [provider]      Allergies    Lisinopril    Review of Systems   Review of Systems  Constitutional: Negative.   HENT: Negative.    Respiratory: Negative.    Cardiovascular: Negative.   Gastrointestinal:  Positive for nausea. Negative for abdominal distention, abdominal pain, anal bleeding, diarrhea and vomiting.  Genitourinary:  Positive for flank pain, frequency and urgency. Negative for decreased urine volume, difficulty urinating, dysuria, hematuria, menstrual problem, vaginal bleeding, vaginal discharge and vaginal pain.    Physical Exam Updated Vital Signs BP (!) 173/90   Pulse 64   Temp 98 F (36.7 C)   Resp (!) 22   Ht '5\' 4"'$  (1.626 m)   Wt 104.3 kg   SpO2 98%   BMI 39.48 kg/m  Physical Exam Vitals and nursing note reviewed.  Constitutional:      Appearance: She is obese. She is not ill-appearing or toxic-appearing.  HENT:     Head: Normocephalic and atraumatic.     Mouth/Throat:     Mouth: Mucous membranes are moist.     Pharynx: No oropharyngeal exudate or posterior oropharyngeal erythema.  Eyes:     General:        Right eye: No discharge.        Left eye: No discharge.     Conjunctiva/sclera: Conjunctivae normal.  Cardiovascular:     Rate and  Rhythm: Normal rate and regular rhythm.     Pulses: Normal pulses.     Heart sounds: Normal heart sounds. No murmur heard. Pulmonary:     Effort: Pulmonary effort is normal. No respiratory distress.     Breath sounds: Normal breath sounds. No wheezing or rales.  Abdominal:     General: Bowel sounds are normal. There is no distension.     Palpations: Abdomen is soft.     Tenderness: There is abdominal tenderness in the right upper quadrant, right lower quadrant and periumbilical area. There is no right CVA tenderness, left CVA tenderness, guarding or rebound. Negative signs include Murphy's sign and Rovsing's sign.     Hernia: No hernia is  present.  Musculoskeletal:        General: No deformity.     Cervical back: Neck supple.  Skin:    General: Skin is warm and dry.  Neurological:     Mental Status: She is alert. Mental status is at baseline.  Psychiatric:        Mood and Affect: Mood normal.     ED Results / Procedures / Treatments   Labs (all labs ordered are listed, but only abnormal results are displayed) Labs Reviewed  COMPREHENSIVE METABOLIC PANEL - Abnormal; Notable for the following components:      Result Value   CO2 19 (*)    Glucose, Bld 110 (*)    All other components within normal limits  LIPASE, BLOOD  CBC WITH DIFFERENTIAL/PLATELET  URINALYSIS, ROUTINE W REFLEX MICROSCOPIC  PREGNANCY, URINE    EKG None  Radiology CT Renal Stone Study  Result Date: 10/04/2022 CLINICAL DATA:  57 year old female with history of abdominal and flank pain. Suspected kidney stone. EXAM: CT ABDOMEN AND PELVIS WITHOUT CONTRAST TECHNIQUE: Multidetector CT imaging of the abdomen and pelvis was performed following the standard protocol without IV contrast. RADIATION DOSE REDUCTION: This exam was performed according to the departmental dose-optimization program which includes automated exposure control, adjustment of the mA and/or kV according to patient size and/or use of iterative reconstruction technique. COMPARISON:  CT of the abdomen and pelvis 07/31/2017. FINDINGS: Lower chest: Unremarkable. Hepatobiliary: Diffuse low attenuation throughout the hepatic parenchyma, indicative of a background of hepatic steatosis. No definite suspicious cystic or solid hepatic lesions are confidently identified on today's noncontrast CT examination. Status post cholecystectomy. Pancreas: No definite pancreatic mass or peripancreatic fluid collections or inflammatory changes are noted on today's noncontrast CT examination. Spleen: Unremarkable. Adrenals/Urinary Tract: There are no abnormal calcifications within the collecting system of either  kidney, along the course of either ureter, or within the lumen of the urinary bladder. No hydroureteronephrosis or perinephric stranding to suggest urinary tract obstruction at this time. The unenhanced appearance of the right kidney is unremarkable bilaterally. Exophytic low-attenuation 1 cm lesion in the lateral aspect of the interpolar region of the left kidney, incompletely characterized on today's noncontrast CT examination, but statistically likely a cyst (no imaging follow-up recommended). Urinary bladder is unremarkable in appearance. Bilateral adrenal glands are normal in appearance. Stomach/Bowel: Unenhanced appearance of the stomach is normal. There is no pathologic dilatation of small bowel or colon. A few scattered colonic diverticula are noted, most evident in the sigmoid colon, without surrounding inflammatory changes to indicate an acute diverticulitis at this time. Normal appendix. Vascular/Lymphatic: No atherosclerotic calcifications are noted in the abdominal aorta or pelvic vasculature. No lymphadenopathy noted in the abdomen or pelvis. Reproductive: Uterus and ovaries are unremarkable in appearance. Other: No significant volume of  ascites.  No pneumoperitoneum. Musculoskeletal: There are no aggressive appearing lytic or blastic lesions noted in the visualized portions of the skeleton. IMPRESSION: 1. No acute findings are noted in the abdomen or pelvis to account for the patient's symptoms. 2. Hepatic steatosis. 3. Mild colonic diverticulosis without evidence to suggest an acute diverticulitis at this time. 4. Additional incidental findings, as above. Electronically Signed   By: Vinnie Langton M.D.   On: 10/04/2022 06:53    Procedures Procedures    Medications Ordered in ED Medications - No data to display  ED Course/ Medical Decision Making/ A&P                             Medical Decision Making 57 year old female presents with right flank pain.  Hypertensive on intake, vitals  otherwise normal.  Cardiopulmonary sounds normal, abdominal exam is with very mild right-sided CVAT and right lower abdominal tenderness to palpation.  The differential diagnosis of emergent flank pain includes, but is not limited to: Nephrolithiasis/ Renal Colic, Pyelonephritis, Abdominal aortic aneurysm, Aortic dissection, Renal artery embolism, Renal vein thrombosis, Renal infarction, Renal hemorrhage, Mesenteric ischemia, Bladder tumor, Cystitis, Biliary colic, Pancreatitis, Perforated peptic ulcer,  Appendicitis, Inguinal Hernia, Diverticulitis, Bowel obstruction. Shingles, Lower lobe pneumonia, Retroperitoneal hematoma/abscess/tumor, Epidural abscess, Epidural hematoma.  Particularly in females it is important to consider (Ectopic Pregnancy,PID/TOA,Ovarian cyst, Ovarian torsion, STD)    Amount and/or Complexity of Data Reviewed Labs: ordered.    Details: CBC without leukocytosis or anemia, CMP unremarkable, lipase is normal.   Care of this patient signed out to oncoming ED provider Margarita Mail, PA-C at time of shift change pending CT renal study and urinalysis.  Analgesia offered, patient declined.  Britanny voiced understanding of her medical evaluation and treatment plan. Each of their questions answered to their expressed satisfaction.  This chart was dictated using voice recognition software, Dragon. Despite the best efforts of this provider to proofread and correct errors, errors may still occur which can change documentation meaning.  Final Clinical Impression(s) / ED Diagnoses Final diagnoses:  None    Rx / DC Orders ED Discharge Orders     None         Aura Dials 10/04/22 0710    Ripley Fraise, MD 10/05/22 [phone]

## 2022-10-04 NOTE — Discharge Instructions (Signed)
SEEK IMMEDIATE MEDICAL ATTENTION IF: New numbness, tingling, weakness, or problem with the use of your arms or legs.  Severe back pain not relieved with medications.  Change in bowel or bladder control.  Increasing pain in any areas of the body (such as chest or abdominal pain).  Shortness of breath, dizziness or fainting.  Nausea (feeling sick to your stomach), vomiting, fever, or sweats.  

## 2023-11-07 DIAGNOSIS — Z833 Family history of diabetes mellitus: Secondary | ICD-10-CM | POA: Diagnosis not present

## 2023-11-07 DIAGNOSIS — I10 Essential (primary) hypertension: Secondary | ICD-10-CM | POA: Diagnosis not present

## 2023-11-07 DIAGNOSIS — Z8249 Family history of ischemic heart disease and other diseases of the circulatory system: Secondary | ICD-10-CM | POA: Diagnosis not present

## 2023-11-08 DIAGNOSIS — Z6839 Body mass index (BMI) 39.0-39.9, adult: Secondary | ICD-10-CM | POA: Diagnosis not present

## 2023-11-08 DIAGNOSIS — I10 Essential (primary) hypertension: Secondary | ICD-10-CM | POA: Diagnosis not present

## 2023-11-08 DIAGNOSIS — Z760 Encounter for issue of repeat prescription: Secondary | ICD-10-CM | POA: Diagnosis not present

## 2023-11-08 DIAGNOSIS — J301 Allergic rhinitis due to pollen: Secondary | ICD-10-CM | POA: Diagnosis not present

## 2023-12-06 ENCOUNTER — Other Ambulatory Visit: Payer: Self-pay | Admitting: Internal Medicine

## 2023-12-06 ENCOUNTER — Ambulatory Visit (INDEPENDENT_AMBULATORY_CARE_PROVIDER_SITE_OTHER): Payer: Self-pay | Admitting: Internal Medicine

## 2023-12-06 ENCOUNTER — Ambulatory Visit (INDEPENDENT_AMBULATORY_CARE_PROVIDER_SITE_OTHER)

## 2023-12-06 ENCOUNTER — Other Ambulatory Visit: Payer: Self-pay

## 2023-12-06 ENCOUNTER — Encounter: Payer: Self-pay | Admitting: Internal Medicine

## 2023-12-06 VITALS — BP 122/78 | HR 72 | Temp 98.0°F | Ht 64.0 in | Wt 237.4 lb

## 2023-12-06 DIAGNOSIS — K760 Fatty (change of) liver, not elsewhere classified: Secondary | ICD-10-CM

## 2023-12-06 DIAGNOSIS — R0609 Other forms of dyspnea: Secondary | ICD-10-CM

## 2023-12-06 DIAGNOSIS — L2082 Flexural eczema: Secondary | ICD-10-CM | POA: Diagnosis not present

## 2023-12-06 DIAGNOSIS — R591 Generalized enlarged lymph nodes: Secondary | ICD-10-CM

## 2023-12-06 DIAGNOSIS — M7731 Calcaneal spur, right foot: Secondary | ICD-10-CM | POA: Diagnosis not present

## 2023-12-06 DIAGNOSIS — M79672 Pain in left foot: Secondary | ICD-10-CM

## 2023-12-06 DIAGNOSIS — M79671 Pain in right foot: Secondary | ICD-10-CM

## 2023-12-06 DIAGNOSIS — M1811 Unilateral primary osteoarthritis of first carpometacarpal joint, right hand: Secondary | ICD-10-CM | POA: Diagnosis not present

## 2023-12-06 DIAGNOSIS — Z09 Encounter for follow-up examination after completed treatment for conditions other than malignant neoplasm: Secondary | ICD-10-CM

## 2023-12-06 DIAGNOSIS — E063 Autoimmune thyroiditis: Secondary | ICD-10-CM

## 2023-12-06 DIAGNOSIS — R0602 Shortness of breath: Secondary | ICD-10-CM | POA: Diagnosis not present

## 2023-12-06 DIAGNOSIS — M7662 Achilles tendinitis, left leg: Secondary | ICD-10-CM | POA: Diagnosis not present

## 2023-12-06 DIAGNOSIS — M7732 Calcaneal spur, left foot: Secondary | ICD-10-CM | POA: Diagnosis not present

## 2023-12-06 DIAGNOSIS — M7661 Achilles tendinitis, right leg: Secondary | ICD-10-CM | POA: Diagnosis not present

## 2023-12-06 DIAGNOSIS — R1900 Intra-abdominal and pelvic swelling, mass and lump, unspecified site: Secondary | ICD-10-CM | POA: Diagnosis not present

## 2023-12-06 DIAGNOSIS — M19041 Primary osteoarthritis, right hand: Secondary | ICD-10-CM | POA: Diagnosis not present

## 2023-12-06 DIAGNOSIS — E785 Hyperlipidemia, unspecified: Secondary | ICD-10-CM

## 2023-12-06 DIAGNOSIS — M19042 Primary osteoarthritis, left hand: Secondary | ICD-10-CM | POA: Diagnosis not present

## 2023-12-06 DIAGNOSIS — M79642 Pain in left hand: Secondary | ICD-10-CM | POA: Diagnosis not present

## 2023-12-06 DIAGNOSIS — Z0000 Encounter for general adult medical examination without abnormal findings: Secondary | ICD-10-CM | POA: Diagnosis not present

## 2023-12-06 DIAGNOSIS — R202 Paresthesia of skin: Secondary | ICD-10-CM

## 2023-12-06 DIAGNOSIS — K5730 Diverticulosis of large intestine without perforation or abscess without bleeding: Secondary | ICD-10-CM

## 2023-12-06 DIAGNOSIS — R229 Localized swelling, mass and lump, unspecified: Secondary | ICD-10-CM

## 2023-12-06 DIAGNOSIS — M79641 Pain in right hand: Secondary | ICD-10-CM | POA: Diagnosis not present

## 2023-12-06 DIAGNOSIS — R161 Splenomegaly, not elsewhere classified: Secondary | ICD-10-CM

## 2023-12-06 DIAGNOSIS — Z1231 Encounter for screening mammogram for malignant neoplasm of breast: Secondary | ICD-10-CM

## 2023-12-06 DIAGNOSIS — K7460 Unspecified cirrhosis of liver: Secondary | ICD-10-CM

## 2023-12-06 DIAGNOSIS — M778 Other enthesopathies, not elsewhere classified: Secondary | ICD-10-CM | POA: Diagnosis not present

## 2023-12-06 LAB — COMPREHENSIVE METABOLIC PANEL WITH GFR
ALT: 37 U/L — ABNORMAL HIGH (ref 0–35)
AST: 30 U/L (ref 0–37)
Albumin: 4.6 g/dL (ref 3.5–5.2)
Alkaline Phosphatase: 68 U/L (ref 39–117)
BUN: 13 mg/dL (ref 6–23)
CO2: 26 meq/L (ref 19–32)
Calcium: 9.8 mg/dL (ref 8.4–10.5)
Chloride: 104 meq/L (ref 96–112)
Creatinine, Ser: 0.61 mg/dL (ref 0.40–1.20)
GFR: 97.35 mL/min (ref >60.00)
Glucose, Bld: 108 mg/dL — ABNORMAL HIGH (ref 70–99)
Potassium: 4.3 meq/L (ref 3.5–5.1)
Sodium: 139 meq/L (ref 135–145)
Total Bilirubin: 0.6 mg/dL (ref 0.2–1.2)
Total Protein: 7.2 g/dL (ref 6.0–8.3)

## 2023-12-06 LAB — LIPID PANEL
Cholesterol: 188 mg/dL (ref 0–200)
HDL: 36.3 mg/dL — ABNORMAL LOW (ref >39.00)
LDL Cholesterol: 101 mg/dL — ABNORMAL HIGH (ref 0–99)
NonHDL: 151.64
Total CHOL/HDL Ratio: 5
Triglycerides: 251 mg/dL — ABNORMAL HIGH (ref 0.0–149.0)
VLDL: 50.2 mg/dL — ABNORMAL HIGH (ref 0.0–40.0)

## 2023-12-06 LAB — CBC WITH DIFFERENTIAL/PLATELET
Basophils Absolute: 0 10*3/uL (ref 0.0–0.1)
Basophils Relative: 0.7 % (ref 0.0–3.0)
Eosinophils Absolute: 0.4 10*3/uL (ref 0.0–0.7)
Eosinophils Relative: 7.5 % — ABNORMAL HIGH (ref 0.0–5.0)
HCT: 44.3 % (ref 36.0–46.0)
Hemoglobin: 14.5 g/dL (ref 12.0–15.0)
Lymphocytes Relative: 34.8 % (ref 12.0–46.0)
Lymphs Abs: 2 10*3/uL (ref 0.7–4.0)
MCHC: 32.8 g/dL (ref 30.0–36.0)
MCV: 86.5 fl (ref 78.0–100.0)
Monocytes Absolute: 0.3 10*3/uL (ref 0.1–1.0)
Monocytes Relative: 5.9 % (ref 3.0–12.0)
Neutro Abs: 2.9 10*3/uL (ref 1.4–7.7)
Neutrophils Relative %: 51.1 % (ref 43.0–77.0)
Platelets: 198 10*3/uL (ref 150.0–400.0)
RBC: 5.12 Mil/uL — ABNORMAL HIGH (ref 3.87–5.11)
RDW: 14.2 % (ref 11.5–15.5)
WBC: 5.8 10*3/uL (ref 4.0–10.5)

## 2023-12-06 LAB — SEDIMENTATION RATE: Sed Rate: 19 mm/h (ref 0–30)

## 2023-12-06 LAB — C-REACTIVE PROTEIN: CRP: <1 mg/dL (ref 0.5–20.0)

## 2023-12-06 LAB — FERRITIN: Ferritin: 152.5 ng/mL (ref 10.0–291.0)

## 2023-12-06 LAB — HEMOGLOBIN A1C: Hgb A1c MFr Bld: 5.8 % (ref 4.6–6.5)

## 2023-12-06 LAB — LIPASE: Lipase: 35 U/L (ref 11.0–59.0)

## 2023-12-06 LAB — GAMMA GT: GGT: 49 U/L (ref 7–51)

## 2023-12-06 LAB — AMYLASE: Amylase: 35 U/L (ref 27–131)

## 2023-12-06 MED ORDER — MOMETASONE FUROATE 0.1 % EX CREA: TOPICAL_CREAM | CUTANEOUS | 1 refills | Status: DC

## 2023-12-06 MED ORDER — TIRZEPATIDE-WEIGHT MANAGEMENT 2.5 MG/0.5ML ~~LOC~~ SOAJ: 2.5000 mg | SUBCUTANEOUS | 11 refills | Status: DC

## 2023-12-06 NOTE — Progress Notes (Signed)
 [facility]  Phone: [phone]  - Medical Office Visit -  Visit Date: 12/06/2023 Patient: Patricia Gross   DOB: 1965-08-18   58 y.o. Female  MRN: [id number] Patient Care Team: Anthon Kins, MD as PCP - General (Internal Medicine) Today's Health Care Provider: Anthon Kins, MD  ===========================================    Chief Complaint / Reason for Visit: new pt (Pt is present to est care with pcp. Pt is concerned about weight eating good and still gaining weight. Also concerned about spots on face as well. Would like to get blood work done today to check liver.)  Background: 58 y.o. female who has Fatty liver; Benign essential HTN; Left lower quadrant pain; Hematuria; Lumbar pain; Acute lumbar myofascial strain; Musculoskeletal pain; DOE (dyspnea on exertion); Morbid obesity due to excess calories (HCC) complicated by HBP/LVH; Generalized lymphadenopathy; Abdominal swelling; Paresthesia of upper and lower extremities of both sides; Flexural eczema; Arthritis of both hands; Heel pain, bilateral; Subcutaneous nodule; Hashimoto thyroiditis; Dyslipidemia; and Diverticula of colon on their problem list.  Discussed the use of AI scribe software for clinical note transcription with the patient, who gave verbal consent to proceed.  History of Present Illness 58 year old female with a past medical history of fatty liver disease, hypertension, arthritis, and eczema presents today with concerns about multiple subcutaneous nodules and ongoing weight management issues. The patient reports multiple nodules throughout her body, which she describes as "little nodules" or "balls," present for at least one year. These nodules are located on her right forehead near the hairline, right lateral shin, forearm, and both buttocks. She describes them as feeling lumpy but not painful, which has been causing her concern due to their persistence and widespread distribution. She has been  diagnosed with fatty liver disease after previous liver function testing. Despite implementing dietary changes including reducing sugar, flour, and carbohydrates, she continues to experience weight gain, which she finds particularly distressing. Review of her weight trend shows a steady increase from 222 lbs in November 2018 to her current weight of 237 lbs. She denies significant alcohol consumption, reporting only occasional beer during summer months. She notes it has been over three years since her last liver ultrasound. The patient also describes a sensation she characterizes as "ants" crawling in her arms and legs at night, which she associates with neuropathy. There is no history of diabetes, though prior labs showed mildly elevated glucose. These paresthesias occur primarily at rest and are more noticeable in the evening hours. Additionally, she reports pain in her hands, particularly in the fingers, with a characteristic pattern of being worse in the morning and gradually improving throughout the day. This pain pattern suggests an inflammatory component, and she suspects arthritis as the underlying cause. She denies significant swelling, redness, or warmth in the affected joints. Current medications include bisoprolol  5 mg daily for hypertension and mometasone cream as needed for eczema management. She previously used spironolactone  but reports this was discontinued. She has recently been started on tirzepatide for weight management. Preventive health maintenance is due, including screening mammogram and follow-up on immunizations. Her last colonoscopy in 2023 was reportedly normal. Recent CT scan from March 2024 showed hepatic steatosis, mild colonic diverticulosis, and no significant ascites. The patient is seeking evaluation of the nodules, management of her weight issues, and assessment of her paresthesias and hand pain.   Problem overviews updated today: Problem  Heel Pain, Bilateral   Subcutaneous Nodule  Hashimoto Thyroiditis  Dyslipidemia  Diverticula of Colon  Medications updated/reviewed: Current Outpatient Medications on File Prior to Visit  Medication Sig   bisoprolol  (ZEBETA ) 5 MG tablet Take 1 tablet (5 mg total) by mouth daily.   No current facility-administered medications on file prior to visit.   Medications Discontinued During This Encounter  Medication Reason   albuterol  (VENTOLIN  HFA) 108 (90 Base) MCG/ACT inhaler Completed Course   montelukast (SINGULAIR) 10 MG tablet Completed Course   Vitamin D, Ergocalciferol, (DRISDOL) 1.25 MG (50000 UNIT) CAPS capsule Completed Course   pravastatin (PRAVACHOL) 20 MG tablet Completed Course   ferrous sulfate 325 (65 FE) MG tablet Completed Course   lidocaine  (LIDODERM ) 5 % Completed Course   naproxen  (NAPROSYN ) 375 MG tablet Completed Course   methocarbamol  (ROBAXIN ) 500 MG tablet Completed Course   spironolactone  (ALDACTONE ) 25 MG tablet Completed Course   Current Meds  Medication Sig   bisoprolol  (ZEBETA ) 5 MG tablet Take 1 tablet (5 mg total) by mouth daily.   mometasone (ELOCON) 0.1 % cream Apply as needed to skin rash/eczema   tirzepatide (ZEPBOUND) 2.5 MG/0.5ML Pen Inject 2.5 mg into the skin once a week.    Allergies:   Allergies as of 12/06/2023 - Review Complete 12/06/2023  Allergen Reaction Noted   Lisinopril Shortness Of Breath 04/06/2012   Past Medical History:  has a past medical history of Arthritis, Asthma, Chronic pain, Hypertension, Insulin resistance, Sleep apnea, and Vitamin D deficiency. Past Surgical History:   has a past surgical history that includes Gallbladder surgery and Cholecystectomy. Social History:   reports that she has never smoked. She has never used smokeless tobacco. She reports that she does not drink alcohol and does not use drugs. Family History:  family history includes Alcohol abuse in her father; Aneurysm in her mother. Depression Screen and Health  Maintenance:    12/06/2023    9:12 AM 08/02/2017    5:27 PM 07/29/2017    9:59 AM 06/16/2017    9:34 AM  PHQ 2/9 Scores  PHQ - 2 Score 0 0 0 0  PHQ- 9 Score 0      Health Maintenance  Topic Date Due   Colonoscopy  Never done   MAMMOGRAM  Never done   COVID-19 Vaccine (1) 12/22/2023 (Originally 10/08/1970)   Zoster Vaccines- Shingrix (1 of 2) 03/07/2024 (Originally 10/09/1982)   Cervical Cancer Screening (HPV/Pap Cotest)  12/05/2024 (Originally 06/16/2022)   DTaP/Tdap/Td (1 - Tdap) 12/05/2024 (Originally 10/09/1982)   Pneumococcal Vaccine 50-69 Years old (1 of 2 - PCV) 12/05/2024 (Originally 10/09/1982)   Hepatitis C Screening  12/05/2024 (Originally 10/08/1981)   HIV Screening  12/05/2024 (Originally 10/09/1978)   INFLUENZA VACCINE  03/01/2024   HPV VACCINES  Aged Out   Meningococcal B Vaccine  Aged Out   Immunization History  Administered Date(s) Administered   Influenza,inj,Quad PF,6+ Mos 06/16/2017     Objective   Physical ExamBP 122/78   Pulse 72   Temp 98 F (36.7 C) (Temporal)   Ht 5\' 4"  (1.626 m)   Wt 237 lb 6.4 oz (107.7 kg)   SpO2 98%   BMI 40.75 kg/m  Wt Readings from Last 10 Encounters:  12/06/23 237 lb 6.4 oz (107.7 kg)  10/04/22 230 lb (104.3 kg)  01/01/20 230 lb 6.4 oz (104.5 kg)  05/31/19 228 lb 12.8 oz (103.8 kg)  05/23/19 228 lb (103.4 kg)  05/11/19 230 lb (104.3 kg)  08/02/17 225 lb 12.8 oz (102.4 kg)  07/31/17 225 lb (102.1 kg)  07/29/17 223 lb 2 oz (101.2 kg)  06/16/17 222 lb (100.7 kg)  Vital signs reviewed.  Nursing notes reviewed. Weight trend reviewed. General Appearance:  Well developed, well nourished, well-groomed, healthy-appearing female with Body mass index is 40.75 kg/m. No acute distress appreciable.   Skin: Clear and well-hydrated. Pulmonary:  Normal work of breathing at rest, no respiratory distress apparent. SpO2: 98 %  Musculoskeletal: She demonstrates smooth and coordinated movements throughout all major joints.All extremities are  intact.  Neurological:  Awake, alert, oriented, and engaged.  No obvious focal neurological deficits or cognitive impairments.  Sensorium seems unclouded.  Psychiatric:  Appropriate mood, pleasant and cooperative demeanor, cheerful and engaged during the exam  Reviewed Results & Data Results LABS Liver function testing: indicative of fatty liver  DIAGNOSTIC Colonoscopy: normal (2023)   Results for orders placed or performed in visit on 12/06/23  Lipid panel  Result Value Ref Range   Cholesterol 188 0 - 200 mg/dL   Triglycerides 409.8 (H) 0.0 - 149.0 mg/dL   HDL 11.91 (L) >47.82 mg/dL   VLDL 95.6 (H) 0.0 - 21.3 mg/dL   LDL Cholesterol 086 (H) 0 - 99 mg/dL   Total CHOL/HDL Ratio 5    NonHDL 151.64   Comprehensive metabolic panel with GFR  Result Value Ref Range   Sodium 139 135 - 145 mEq/L   Potassium 4.3 3.5 - 5.1 mEq/L   Chloride 104 96 - 112 mEq/L   CO2 26 19 - 32 mEq/L   Glucose, Bld 108 (H) 70 - 99 mg/dL   BUN 13 6 - 23 mg/dL   Creatinine, Ser 5.78 0.40 - 1.20 mg/dL   Total Bilirubin 0.6 0.2 - 1.2 mg/dL   Alkaline Phosphatase 68 39 - 117 U/L   AST 30 0 - 37 U/L   ALT 37 (H) 0 - 35 U/L   Total Protein 7.2 6.0 - 8.3 g/dL   Albumin 4.6 3.5 - 5.2 g/dL   GFR 46.96 >29.52 mL/min   Calcium  9.8 8.4 - 10.5 mg/dL  TSH Rfx on Abnormal to Free T4  Result Value Ref Range   TSH WILL FOLLOW   Hemoglobin A1c  Result Value Ref Range   Hgb A1c MFr Bld 5.8 4.6 - 6.5 %  Sedimentation rate  Result Value Ref Range   Sed Rate 19 0 - 30 mm/hr  C-reactive protein  Result Value Ref Range   CRP <1.0 0.5 - 20.0 mg/dL  FIB-4 W/REFLEX TO ELF  Result Value Ref Range   AST WILL FOLLOW    ALT WILL FOLLOW    Platelets 215 150 - 450 x10E3/uL   FIB-4 Index WILL FOLLOW   Gamma GT  Result Value Ref Range   GGT 49 7 - 51 U/L  Ferritin  Result Value Ref Range   Ferritin 152.5 10.0 - 291.0 ng/mL  Amylase  Result Value Ref Range   Amylase 35 27 - 131 U/L  Lipase  Result Value Ref Range    Lipase 35.0 11.0 - 59.0 U/L  Urinalysis, Complete  Result Value Ref Range   Color, Urine YELLOW YELLOW   APPearance CLEAR CLEAR   Specific Gravity, Urine 1.013 1.001 - 1.035   pH 5.5 5.0 - 8.0   Glucose, UA NEGATIVE NEGATIVE   Bilirubin Urine NEGATIVE NEGATIVE   Ketones, ur NEGATIVE NEGATIVE   Hgb urine dipstick NEGATIVE NEGATIVE   Protein, ur NEGATIVE NEGATIVE   Nitrite NEGATIVE NEGATIVE   Leukocytes,Ua NEGATIVE NEGATIVE   WBC, UA NONE SEEN 0 - 5 /HPF   RBC /  HPF NONE SEEN 0 - 2 /HPF   Squamous Epithelial / HPF 0-5 < OR = 5 /HPF   Bacteria, UA NONE SEEN NONE SEEN /HPF   Hyaline Cast NONE SEEN NONE SEEN /LPF   Note    CBC with Differential/Platelet  Result Value Ref Range   WBC 5.8 4.0 - 10.5 K/uL   RBC 5.12 (H) 3.87 - 5.11 Mil/uL   Hemoglobin 14.5 12.0 - 15.0 g/dL   HCT 82.9 56.2 - 13.0 %   MCV 86.5 78.0 - 100.0 fl   MCHC 32.8 30.0 - 36.0 g/dL   RDW 86.5 78.4 - 69.6 %   Platelets 198.0 150.0 - 400.0 K/uL   Neutrophils Relative % 51.1 43.0 - 77.0 %   Lymphocytes Relative 34.8 12.0 - 46.0 %   Monocytes Relative 5.9 3.0 - 12.0 %   Eosinophils Relative 7.5 (H) 0.0 - 5.0 %   Basophils Relative 0.7 0.0 - 3.0 %   Neutro Abs 2.9 1.4 - 7.7 K/uL   Lymphs Abs 2.0 0.7 - 4.0 K/uL   Monocytes Absolute 0.3 0.1 - 1.0 K/uL   Eosinophils Absolute 0.4 0.0 - 0.7 K/uL   Basophils Absolute 0.0 0.0 - 0.1 K/uL    Office Visit on 12/06/2023  Component Date Value   Cholesterol 12/06/2023 188    Triglycerides 12/06/2023 251.0 (H)    HDL 12/06/2023 36.30 (L)    VLDL 12/06/2023 50.2 (H)    LDL Cholesterol 12/06/2023 101 (H)    Total CHOL/HDL Ratio 12/06/2023 5    NonHDL 12/06/2023 151.64    Sodium 12/06/2023 139    Potassium 12/06/2023 4.3    Chloride 12/06/2023 104    CO2 12/06/2023 26    Glucose, Bld 12/06/2023 108 (H)    BUN 12/06/2023 13    Creatinine, Ser 12/06/2023 0.61    Total Bilirubin 12/06/2023 0.6    Alkaline Phosphatase 12/06/2023 68    AST 12/06/2023 30    ALT  12/06/2023 37 (H)    Total Protein 12/06/2023 7.2    Albumin 12/06/2023 4.6    GFR 12/06/2023 97.35    Calcium  12/06/2023 9.8    TSH 12/06/2023 WILL FOLLOW    Hgb A1c MFr Bld 12/06/2023 5.8    Sed Rate 12/06/2023 19    CRP 12/06/2023 <1.0    AST 12/06/2023 WILL FOLLOW    ALT 12/06/2023 WILL FOLLOW    Platelets 12/06/2023 215    FIB-4 Index 12/06/2023 WILL FOLLOW    GGT 12/06/2023 49    Ferritin 12/06/2023 152.5    Amylase 12/06/2023 35    Lipase 12/06/2023 35.0    Color, Urine 12/06/2023 YELLOW    APPearance 12/06/2023 CLEAR    Specific Gravity, Urine 12/06/2023 1.013    pH 12/06/2023 5.5    Glucose, UA 12/06/2023 NEGATIVE    Bilirubin Urine 12/06/2023 NEGATIVE    Ketones, ur 12/06/2023 NEGATIVE    Hgb urine dipstick 12/06/2023 NEGATIVE    Protein, ur 12/06/2023 NEGATIVE    Nitrite 12/06/2023 NEGATIVE    Leukocytes,Ua 12/06/2023 NEGATIVE    WBC, UA 12/06/2023 NONE SEEN    RBC / HPF 12/06/2023 NONE SEEN    Squamous Epithelial / HPF 12/06/2023 0-5    Bacteria, UA 12/06/2023 NONE SEEN    Hyaline Cast 12/06/2023 NONE SEEN    Note 12/06/2023     WBC 12/06/2023 5.8    RBC 12/06/2023 5.12 (H)    Hemoglobin 12/06/2023 14.5    HCT 12/06/2023 44.3    MCV 12/06/2023 86.5  MCHC 12/06/2023 32.8    RDW 12/06/2023 14.2    Platelets 12/06/2023 198.0    Neutrophils Relative % 12/06/2023 51.1    Lymphocytes Relative 12/06/2023 34.8    Monocytes Relative 12/06/2023 5.9    Eosinophils Relative 12/06/2023 7.5 (H)    Basophils Relative 12/06/2023 0.7    Neutro Abs 12/06/2023 2.9    Lymphs Abs 12/06/2023 2.0    Monocytes Absolute 12/06/2023 0.3    Eosinophils Absolute 12/06/2023 0.4    Basophils Absolute 12/06/2023 0.0    No image results found.   DG Hand Complete Left Result Date: 12/06/2023 CLINICAL DATA:  Left hand pain for 1 year. EXAM: LEFT HAND - COMPLETE 3+ VIEW COMPARISON:  None Available. FINDINGS: Neutral ulnar variance. Mild thumb carpometacarpal joint space narrowing,  subchondral sclerosis, and peripheral osteophytosis. Mild to moderate second through fifth DIP joint space narrowing and peripheral osteophytosis, greatest within the second digit. Mild second through fifth PIP joint space narrowing and peripheral osteophytosis. Mild distal radial styloid degenerative spurring. No acute fracture or dislocation. No cortical erosion or periostitis. 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 Electronically Signed   By: Bertina Broccoli M.D.   On: 12/06/2023 15:07   DG Hand Complete Right Result Date: 12/06/2023 CLINICAL DATA:  Bilateral hand pain for 1 year.  No known injury. EXAM: RIGHT HAND - COMPLETE 3+ VIEW COMPARISON:  None Available. FINDINGS: 2 mm ulnar negative variance. Minimal thumb carpometacarpal joint space narrowing, subchondral sclerosis, and peripheral osteophytosis. Mild-to-moderate second through fifth DIP and fifth PIP joint space narrowing and peripheral osteophytosis. There is a 3 mm chronic mineralized density dorsal to the distal aspect of the proximal phalanx of the fifth finger, nonspecific. Mild radiocarpal joint space narrowing. No acute fracture or dislocation. No cortical erosion or periostitis. IMPRESSION: 1. Mild-to-moderate second through fifth DIP and fifth PIP osteoarthritis. 2. Mild radiocarpal and minimal thumb carpometacarpal osteoarthritis. Electronically Signed   By: Bertina Broccoli M.D.   On: 12/06/2023 15:04   DG Chest 2 View Result Date: 12/06/2023 CLINICAL DATA:  Generalized lymphadenopathy.  Shortness of breath. EXAM: CHEST - 2 VIEW COMPARISON:  Chest radiograph 05/13/2019 FINDINGS: Normal heart size and mediastinal contours. No abnormal mediastinal contours to suggest adenopathy by radiograph. Stable mild interstitial thickening. No focal airspace disease, significant pleural effusion or pneumothorax. Normal pulmonary vasculature. Thoracic spondylosis. IMPRESSION: 1. No acute findings.  2. Stable mild interstitial thickening. 3. No radiographic evidence of adenopathy. Electronically Signed   By: Chadwick Colonel M.D.   On: 12/06/2023 13:07   DG Os Calcis Right Result Date: 12/06/2023 CLINICAL DATA:  Heel pain. EXAM: RIGHT OS CALCIS - 2+ VIEW COMPARISON:  None Available. FINDINGS: Moderate to large fragmented Achilles tendon enthesophyte. Moderate plantar calcaneal spur. There are multiple soft tissue calcifications in the plantar region in the region of the plantar fascia, largest 6 mm. No evidence of fracture, erosion or focal bone abnormality. Normal subtalar alignment. IMPRESSION: 1. Moderate to large fragmented Achilles tendon enthesophyte. 2. Moderate plantar calcaneal spur. Multiple soft tissue calcifications in the region of the plantar fascia. Electronically Signed   By: Chadwick Colonel M.D.   On: 12/06/2023 13:06   DG Os Calcis Left Result Date: 12/06/2023 CLINICAL DATA:  Left heel pain. EXAM: LEFT OS CALCIS - 2+ VIEW COMPARISON:  None Available. FINDINGS: Moderate Achilles tendon enthesophyte. Moderate plantar calcaneal spur multiple soft tissue calcifications in the region of the plantar fascia, largest measuring 12 mm. No evidence of fracture,  erosion or focal bone abnormality. Normal subtalar alignment. IMPRESSION: 1. Moderate plantar calcaneal spur with multiple soft tissue calcifications in the region of the plantar fascia. 2. Moderate Achilles tendon enthesophyte. Electronically Signed   By: Chadwick Colonel M.D.   On: 12/06/2023 13:05    CT Renal Stone Study Result Date: 10/04/2022 CLINICAL DATA:  58 year old female with history of abdominal and flank pain. Suspected kidney stone. EXAM: CT ABDOMEN AND PELVIS WITHOUT CONTRAST TECHNIQUE: Multidetector CT imaging of the abdomen and pelvis was performed following the standard protocol without IV contrast. RADIATION DOSE REDUCTION: This exam was performed according to the departmental dose-optimization program which includes  automated exposure control, adjustment of the mA and/or kV according to patient size and/or use of iterative reconstruction technique. COMPARISON:  CT of the abdomen and pelvis 07/31/2017. FINDINGS: Lower chest: Unremarkable. Hepatobiliary: Diffuse low attenuation throughout the hepatic parenchyma, indicative of a background of hepatic steatosis. No definite suspicious cystic or solid hepatic lesions are confidently identified on today's noncontrast CT examination. Status post cholecystectomy. Pancreas: No definite pancreatic mass or peripancreatic fluid collections or inflammatory changes are noted on today's noncontrast CT examination. Spleen: Unremarkable. Adrenals/Urinary Tract: There are no abnormal calcifications within the collecting system of either kidney, along the course of either ureter, or within the lumen of the urinary bladder. No hydroureteronephrosis or perinephric stranding to suggest urinary tract obstruction at this time. The unenhanced appearance of the right kidney is unremarkable bilaterally. Exophytic low-attenuation 1 cm lesion in the lateral aspect of the interpolar region of the left kidney, incompletely characterized on today's noncontrast CT examination, but statistically likely a cyst (no imaging follow-up recommended). Urinary bladder is unremarkable in appearance. Bilateral adrenal glands are normal in appearance. Stomach/Bowel: Unenhanced appearance of the stomach is normal. There is no pathologic dilatation of small bowel or colon. A few scattered colonic diverticula are noted, most evident in the sigmoid colon, without surrounding inflammatory changes to indicate an acute diverticulitis at this time. Normal appendix. Vascular/Lymphatic: No atherosclerotic calcifications are noted in the abdominal aorta or pelvic vasculature. No lymphadenopathy noted in the abdomen or pelvis. Reproductive: Uterus and ovaries are unremarkable in appearance. Other: No significant volume of ascites.   No pneumoperitoneum. Musculoskeletal: There are no aggressive appearing lytic or blastic lesions noted in the visualized portions of the skeleton. IMPRESSION: 1. No acute findings are noted in the abdomen or pelvis to account for the patient's symptoms. 2. Hepatic steatosis. 3. Mild colonic diverticulosis without evidence to suggest an acute diverticulitis at this time. 4. Additional incidental findings, as above. Electronically Signed      Assessment & Plan Generalized lymphadenopathy Assessment: Patient presents with multiple firm, non-tender nodules present for over a year, distributed across the right forehead, right lateral shin, forearm, and buttocks. The differential diagnosis includes benign lipomas, dermatofibromas, angiolipomas, erythema nodosum, sarcoidosis, and lymphoproliferative disorders. Given the widespread distribution and chronicity, a systematic evaluation is warranted to rule out systemic conditions. Plan: Soft tissue ultrasound of right lower and upper extremities to characterize nodules Pelvic ultrasound to evaluate nodules in buttock region Chest X-ray to evaluate for mediastinal lymphadenopathy or pulmonary involvement Complete urinalysis to screen for systemic inflammation Referral to dermatology for evaluation and possible biopsy Monitor for changes in size, number, or character of nodules Consider hematology/oncology referral pending initial results Encounter for screening mammogram for malignant neoplasm of breast  Abdominal swelling Assessment: Reported abdominal swelling with concerns for possible ascites. March 2024 CT noted no significant ascites at that time. Plan: Complete abdominal  ultrasound to assess for ascites and further evaluate liver Comprehensive metabolic panel with liver function tests Amylase and lipase to rule out pancreatic inflammation TSH with reflex to Free T4 to assess thyroid  function Assess for abdominal wall laxity vs fluid  accumulation Correlate findings with liver assessment Paresthesia of upper and lower extremities of both sides Assessment: Patient reports nighttime sensation of "ants" in arms and legs, most consistent with neuropathy. Differential includes peripheral neuropathy, nerve compression syndromes, vitamin deficiencies, and medication side effects. Plan: CBC with differential to screen for anemia or other hematologic abnormalities Comprehensive metabolic panel to assess kidney function and electrolytes Consider vitamin B12, folate, and vitamin D levels if initial workup unrevealing Review and optimize diabetic control if applicable Assess sleep habits and consider sleep study if symptoms suggest sleep-related movement disorder Consider neurology referral if symptoms persist or worsen Morbid obesity due to excess calories (HCC) complicated by HBP/LVH Assessment: BMI 40.75 kg/m with progressive weight gain despite dietary efforts. Obesity complicating multiple other conditions including fatty liver disease, possible joint pain, and increased cardiovascular risk. Plan: Initiate tirzepatide (Zepbound) 2.5 mg weekly for weight management Lipid panel and HbA1c to assess cardiometabolic risk Discuss structured physical activity program appropriate to current fitness level Consider nutrition consult for personalized meal planning Discuss reasonable weight loss goals (5-10% in 6 months) Monitor for medication side effects and efficacy Fatty liver Assessment: Patient has established fatty liver disease with difficulty losing weight despite dietary modifications. Current BMI 40.75 kg/m. Weight has increased from 222 lbs in 2018 to current 237 lbs despite diet restrictions. March 2024 CT scan confirmed hepatic steatosis. Recent labs show mild glucose elevation (110) but otherwise normal liver function tests. Plan: Complete abdominal ultrasound to reassess liver status Comprehensive metabolic panel to monitor  liver function FIB-4 with reflex to ELF to assess for fibrosis Additional liver workup: GGT, ferritin, alpha-1-antitrypsin, hepatitis panel, autoimmune markers Initiate tirzepatide (Zepbound) 2.5 mg subcutaneously weekly for weight management Continue dietary modifications with emphasis on Mediterranean diet pattern Advise alcohol abstinence Aim for 5-10% weight reduction within 6 months Regular monitoring of liver function tests Arthritis of both hands Assessment: Patient reports hand pain, particularly in fingers, worse in morning with improvement throughout the day. Pattern suggests inflammatory arthritis. 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 Plan: Continue mometasone 0.1% cream as needed for active lesions Gentle skin care routine with fragrance-free moisturizers Avoid known triggers and irritants Consider dermatology follow-up if poorly controlled Diverticula of colon  Dyslipidemia  Hashimoto thyroiditis  Heel pain, bilateral Imaging ordered after shared decision making  Healthcare maintenance Assessment: Patient due for multiple preventive health services. Last mammogram 2-3 years ago. Last colonoscopy in 2023 reportedly normal. Cervical cancer screening done 2-3 years ago. Plan: Schedule screening mammogram Gynecology referral for women's health maintenance Update immunizations: COVID-19 vaccine, zoster vaccine Colorectal cancer screening follow-up based on 2023 colonoscopy results Blood pressure  monitoring with current regimen of bisoprolol  5 mg daily Follow-up examination Return visit in 4-6 weeks to review test results and assess response to initiated therapies Bring all home blood pressure readings to next appointment Call office for any new or worsening symptoms Total  face-to-face time spent with patient: 32 minutes, primarily in counseling regarding diagnostic workup and management options for multiple chronic conditions.  Diagnoses and all orders for this visit: Generalized lymphadenopathy -     Urinalysis, Complete -     DG Chest 2 View; Future -     Ambulatory referral to Dermatology -     US  SOFT TISSUE LOWER EXTREMITY LIMITED RIGHT (NON-VASCULAR); Future -     US  SOFT TISSUE RT UPPER EXTREMITY LTD (NON-VASCULAR); Future -     US  Pelvis Limited; Future Encounter for screening mammogram for malignant neoplasm of breast -     Ambulatory referral to Gynecology Abdominal swelling -     US  Abdomen Complete; Future -     Comprehensive metabolic panel with GFR -     CBC with Differential/Platelet; Future -     TSH Rfx on Abnormal to Free T4 -     Amylase -     Lipase Paresthesia of upper and lower extremities of both sides Morbid obesity due to excess calories (HCC) complicated by HBP/LVH -     Lipid panel -     Hemoglobin A1c -     FIB-4 W/REFLEX TO ELF -     tirzepatide (ZEPBOUND) 2.5 MG/0.5ML Pen; Inject 2.5 mg into the skin once a week. Fatty liver -     US  Abdomen Complete; Future -     ANA w/Reflex if Positive -     FIB-4 W/REFLEX TO ELF -     Gamma GT -     Acute Hep Panel & Hep B Surface Ab -     Alpha-1-antitrypsin -     Ferritin -     AntiMicrosomal Ab-Liver / Kidney -     Mitochondrial/smooth muscle ab pnl Arthritis of both hands -     DG Hand Complete Right; Future -     Sedimentation rate -     C-reactive protein -     DG Hand Complete Left; Future Flexural eczema -     mometasone (ELOCON) 0.1 % cream; Apply as needed to skin  rash/eczema Diverticula of colon Dyslipidemia Hashimoto thyroiditis Subcutaneous nodule -     Ambulatory referral to Dermatology -     US  SOFT TISSUE LOWER EXTREMITY LIMITED RIGHT (NON-VASCULAR); Future -     US  SOFT TISSUE RT UPPER EXTREMITY LTD (NON-VASCULAR); Future -     US  Pelvis Limited; Future Heel pain, bilateral Healthcare maintenance Follow-up examination   Recommended follow up: Return in about 3 weeks (around 12/27/2023) for extensive labwrok xr ultrasound review for nodular disease..  Medical Decision Making Attestation Level of Service: [phone] - Office Visit, New Patient, Level 4 I performed and documented a comprehensive history and examination for this 58 year old female patient with multiple presenting problems. Medical decision making was of moderate complexity based on the following elements: Number and Complexity of Problems Addressed Undiagnosed new problem with uncertain prognosis: Patient presents with generalized lymphadenopathy/subcutaneous nodules in multiple locations present for over a year. The differential diagnosis includes benign conditions (lipomas, dermatofibromas), inflammatory conditions (erythema nodosum), and more serious considerations such as sarcoidosis and lymphoproliferative disorders, requiring comprehensive workup. Chronic illness with exacerbation: Fatty liver disease with worsening features as evidenced by continued weight gain despite dietary modifications and previous imaging confirmation of hepatic steatosis. Multiple stable chronic illnesses requiring management: Hypertension (currently controlled on bisoprolol ), flexural eczema (managed with topical therapy), and morbid obesity complicating overall management. Additional undiagnosed problems requiring evaluation: Paresthesia in extremities of unclear  etiology and hand pain with morning pattern suggesting possible inflammatory arthritis. Amount and/or Complexity of Data to be Reviewed and  Analyzed Review of prior external notes: Analysis of prior imaging studies including March 2024 CT scan showing hepatic steatosis. Independent review of image/tracing/specimen: Review of prior lab results showing mildly elevated glucose (110) and otherwise normal liver function tests. Assessment requiring ordering of multiple tests: Comprehensive evaluation plan includes:  Multiple imaging studies (ultrasounds of multiple sites, bilateral hand X-rays, chest X-ray) Extensive laboratory testing (CBC, CMP, inflammatory markers, specialized liver fibrosis assessment, autoimmune markers) Specialty referrals to dermatology and gynecology Risk of Complications and/or Morbidity or Mortality of Patient Management Prescription drug management: Initiation of tirzepatide (Zepbound), a high-risk medication requiring careful monitoring for side effects and response, with specific dosing instructions. Decision regarding further testing with moderate risk: Evaluation for potentially serious underlying conditions including lymphoproliferative disorders, liver fibrosis progression, and inflammatory arthritis. Management of multiple interacting conditions: Obesity complicating liver disease management, potential neuropathy requiring etiology determination, and coordination of multiple specialty evaluations. Diagnostic uncertainty requiring careful monitoring: Need for close follow-up of multiple nodules with potential for serious underlying etiology. The combination of an undiagnosed new problem with uncertain prognosis, chronic illness with exacerbation, analysis of multiple data points, and prescription drug management with moderate risk factors supports a medical decision-making level of moderate complexity. Total face-to-face time spent was 32 minutes, primarily in counseling regarding diagnostic workup and management options.        Additional notes: This document was synthesized by artificial intelligence  (Abridge) using HIPAA-compliant recording of the clinical interaction;   We discussed the use of AI scribe software for clinical note transcription with the patient, who gave verbal consent to proceed.    Additional Info: This encounter employed state-of-the-art, real-time, collaborative documentation. The patient actively reviewed and assisted in updating their electronic medical record on a shared screen, ensuring transparency and facilitating joint problem-solving for the problem list, overview, and plan. This approach promotes accurate, informed care. The treatment plan was discussed and reviewed in detail, including medication safety, potential side effects, and all patient questions. We confirmed understanding and comfort with the plan. Follow-up instructions were established, including contacting the office for any concerns, returning if symptoms worsen, persist, or new symptoms develop, and precautions for potential emergency department visits.  Initial Appointment Goals:  This initial visit focused on establishing a foundation for the patient's care. We collaboratively reviewed her medical history and medications in detail, updating the chart as shown in the encounter. Given the extensive information, we prioritized addressing her most pressing concerns, which she reported were: new pt (Pt is present to est care with pcp. Pt is concerned about weight eating good and still gaining weight. Also concerned about spots on face as well. Would like to get blood work done today to check liver.)  While the complexity of the patient's medical picture may necessitate further evaluation in subsequent visits, we were able to develop a preliminary care plan together. To expedite a comprehensive plan at the next visit, we encouraged the patient to gather relevant medical records from previous providers. This collaborative approach will ensure a more complete understanding of the patient's health and inform the  development of a personalized care plan. We look forward to continuing the conversation and working together with the patient on achieving her health goals.   Collaborative Documentation:  Today's encounter utilized real-time, dynamic patient engagement.  Patients actively participate by directly reviewing and assisting in updating their medical records through  a shared screen. This transparency empowers patients to visually confirm chart updates made by the healthcare provider.  This collaborative approach facilitates problem management as we jointly update the problem list, problem overview, and assessment/plan. Ultimately, this process enhances chart accuracy and completeness, fostering shared decision-making, patient education, and informed consent for tests and treatments.  Collaborative Treatment Planning:  Treatment plans were discussed and reviewed in detail.  Explained medication safety and potential side effects.  Encouraged participation and answered all patient questions, confirming understanding and comfort with the plan. Encouraged patient to contact our office if they have any questions or concerns. Agreed on patient returning to office if symptoms worsen, persist, or new symptoms develop.  ----------------------------------------------------- Anthon Kins, MD  12/07/2023 2:40 PM  [facility]:  [phone]

## 2023-12-07 ENCOUNTER — Telehealth: Payer: Self-pay

## 2023-12-07 ENCOUNTER — Encounter: Payer: Self-pay | Admitting: Internal Medicine

## 2023-12-07 ENCOUNTER — Other Ambulatory Visit (HOSPITAL_COMMUNITY): Payer: Self-pay

## 2023-12-07 DIAGNOSIS — M79672 Pain in left foot: Secondary | ICD-10-CM | POA: Insufficient documentation

## 2023-12-07 DIAGNOSIS — K5730 Diverticulosis of large intestine without perforation or abscess without bleeding: Secondary | ICD-10-CM | POA: Insufficient documentation

## 2023-12-07 DIAGNOSIS — R7303 Prediabetes: Secondary | ICD-10-CM | POA: Insufficient documentation

## 2023-12-07 DIAGNOSIS — R229 Localized swelling, mass and lump, unspecified: Secondary | ICD-10-CM | POA: Insufficient documentation

## 2023-12-07 DIAGNOSIS — E785 Hyperlipidemia, unspecified: Secondary | ICD-10-CM | POA: Insufficient documentation

## 2023-12-07 DIAGNOSIS — E063 Autoimmune thyroiditis: Secondary | ICD-10-CM | POA: Insufficient documentation

## 2023-12-07 DIAGNOSIS — D721 Eosinophilia, unspecified: Secondary | ICD-10-CM | POA: Insufficient documentation

## 2023-12-07 LAB — MITOCHONDRIAL/SMOOTH MUSCLE AB PNL
Mitochondrial Ab: <20 U (ref 0.0–20.0)
Smooth Muscle Ab: 12 U (ref 0–19)

## 2023-12-07 LAB — ANA W/REFLEX IF POSITIVE: Anti Nuclear Antibody (ANA): NEGATIVE

## 2023-12-07 NOTE — Assessment & Plan Note (Signed)
 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 Plan: Continue mometasone 0.1% cream as needed for active lesions Gentle skin care routine with fragrance-free moisturizers Avoid known triggers and irritants Consider dermatology follow-up if poorly controlled

## 2023-12-07 NOTE — Telephone Encounter (Signed)
 Pharmacy Patient Advocate Encounter   Received notification from Onbase that prior authorization for Zepbound 2.5MG /0.5ML pen-injectors is required/requested.   Insurance verification completed.   The patient is insured through [employer] .   Per test claim: PA required; PA started via CoverMyMeds. KEY [id number] . Waiting for clinical questions to populate.

## 2023-12-07 NOTE — Patient Instructions (Signed)
 YOUR VISIT SUMMARY   Today's Findings and Care Plan: SKIN NODULES  We discussed the small nodules you've noticed on your forehead, shin, forearm, and buttocks. While these have been present for over a year without change, we should investigate further to determine their cause. Tests Ordered Purpose  Ultrasound of the nodules To examine the structure and characteristics of the nodules  Chest X-ray To check your lungs and lymph nodes in your chest  Urine test To check for signs of inflammation or other abnormalities  You have been referred to a dermatologist who specializes in skin conditions and can evaluate these nodules further. FATTY LIVER DISEASE & WEIGHT MANAGEMENT  Your CT scan from March confirmed fatty liver disease. We discussed that the most effective treatment is weight loss, though we understand this has been challenging despite your dietary efforts. Plan Details  New medication Zepbound (tirzepatide) 2.5 mg injection once weekly to help with weight loss  Diet recommendations Continue reducing sugar and refined carbohydrates; focus on vegetables, lean proteins, and healthy fats  Additional testing Liver ultrasound and blood tests to check your liver function and rule out other causes  IMPORTANT: Please report any severe abdominal pain, nausea, or vomiting after starting Zepbound as these could indicate side effects.  HAND PAIN AND "ANTS" SENSATION  We discussed your hand pain that's worse in the mornings and the sensation of "ants" in your arms and legs at night. Tests Ordered Purpose  Hand X-rays To check for arthritis or other bone/joint issues  Blood tests To check for inflammation and other possible causes   PREVENTIVE CARE  You're due for the following preventive care: Service Status  Mammogram Please schedule as soon as possible  COVID-19 vaccine Due now  Shingles vaccine Due now   YOUR MEDICATIONS   Medication Instructions  Bisoprolol  5 mg Take 1 tablet by mouth  daily for blood pressure  Mometasone cream Apply to skin rash/eczema as needed  Zepbound (tirzepatide) Inject 2.5 mg under the skin once a week   FOLLOW-UP CARE:    Return to clinic in 4-6 weeks to review test results    Schedule your mammogram as soon as possible    Keep your dermatology appointment when scheduled    Bring a list of all home blood pressure readings to your next visit     CALL THE OFFICE IF YOU EXPERIENCE: Severe stomach pain, persistent nausea, or vomiting after starting Zepbound Any sudden increase in size or pain in your nodules Unexplained fever, chills, or weight loss Severe worsening of the tingling in your extremities Any new or concerning symptoms      Welcome aboard!   Today's visit was a valuable first step in understanding your health and starting your personalized care journey. We discussed your medical history and medications in detail. Given the extensive information, we prioritized addressing your most pressing concerns.  We understood those concerns to be:  new pt (Pt is present to est care with pcp. Pt is concerned about weight eating good and still gaining weight. Also concerned about spots on face as well. Would like to get blood work done today to check liver.)   Building a Complete Picture  To create the most effective care plan possible, we may need additional information from previous providers. We encouraged you to gather any relevant medical records for your next visit. This will help us  build a more complete picture and develop a personalized plan together. In the meantime, we'll address your immediate concerns and  provide resources to help you manage all of your medical issues.  We encourage you to use MyChart to review these efforts, and to help us  find and correct any omissions or errors in your medical chart.  Managing Your Health Over Time  Managing every aspect of your health in a single visit isn't always feasible, but that's okay.   We addressed your most pressing concerns today and charted a course for future care. Acute conditions or preventive care measures may require further attention.  We encourage you to schedule a follow-up visit at your earliest convenience to discuss any unresolved issues.  We strongly encourage participation in annual preventive care visits to help us  develop a more thorough understanding of your health and to help you maintain optimal wellness - please inquire about scheduling your next one with us  at your earliest convenience.  Your Satisfaction Matters  It was a pleasure seeing you today!  Your health and satisfaction will always be my top priorities. If you believe your experience today was worthy of a 5-star rating, I'd be grateful for your feedback!  Anthon Kins, MD   Next Steps  Schedule Follow-Up:  We recommend a follow-up appointment in Return in about 3 weeks (around 12/27/2023) for extensive labwrok xr ultrasound review for nodular disease.. If your condition worsens before then, please call us  or seek emergency care. Preventive Care:  Don't forget to schedule your annual preventive care visit!  This important checkup is typically covered by insurance and helps identify potential health issues early.  Typically its 100% insurance covered with no co-pay and helps to get surveillance labwork paid for through your insurance provider.  Sometimes it even lowers your insurance premiums to participate. Medical Information Release:  For any relevant medical information we don't have, please sign a release form so we can obtain it for your records. Lab & X-ray Appointments:  Scheduled any incomplete lab tests today or call us  to schedule.  X-Rays can be done without an appointment at [facility] ([address], [address]), M-F 8:30am-noon or 1pm-5pm.  Just tell them you're there for X-rays ordered by Dr. Boston Byers.  We'll receive the results and contact you by phone or MyChart to  discuss next steps.  Bring to Your Next Appointment  Medications: Please bring all your medication bottles to your next appointment to ensure we have an accurate record of your prescriptions. Health Diaries: If you're monitoring any health conditions at home, keeping a diary of your readings can be very helpful for discussions at your next appointment.  Reviewing Your Records  Please Review this early draft of your clinical notes below and the final encounter summary tomorrow on MyChart after its been completed.   Generalized lymphadenopathy -     Urinalysis, Complete -     DG Chest 2 View; Future -     Ambulatory referral to Dermatology -     US  SOFT TISSUE LOWER EXTREMITY LIMITED RIGHT (NON-VASCULAR); Future -     US  SOFT TISSUE RT UPPER EXTREMITY LTD (NON-VASCULAR); Future -     US  PELVIS LIMITED (TRANSABDOMINAL ONLY); Future  Encounter for screening mammogram for malignant neoplasm of breast -     Ambulatory referral to Gynecology  Abdominal swelling -     US  Abdomen Complete; Future -     Comprehensive metabolic panel with GFR -     CBC with Differential/Platelet; Future -     TSH Rfx on Abnormal to Free T4 -  Amylase -     Lipase  Paresthesia of upper and lower extremities of both sides  Morbid obesity due to excess calories (HCC) complicated by HBP/LVH -     Lipid panel -     Hemoglobin A1c -     FIB-4 W/REFLEX TO ELF -     Tirzepatide-Weight Management; Inject 2.5 mg into the skin once a week.  Dispense: 2 mL; Refill: 11  Fatty liver -     US  Abdomen Complete; Future -     ANA w/Reflex if Positive -     FIB-4 W/REFLEX TO ELF -     Gamma GT -     Acute Hep Panel & Hep B Surface Ab -     Alpha-1-antitrypsin -     Ferritin -     AntiMicrosomal Ab-Liver / Kidney -     Mitochondrial/smooth muscle ab pnl  Arthritis of both hands -     DG Hand Complete Right; Future -     Sedimentation rate -     C-reactive protein -     DG Hand Complete Left;  Future  Flexural eczema -     Mometasone Furoate; Apply as needed to skin rash/eczema  Dispense: 15 g; Refill: 1  Diverticula of colon  Dyslipidemia  Hashimoto thyroiditis  Subcutaneous nodule -     Ambulatory referral to Dermatology -     US  SOFT TISSUE LOWER EXTREMITY LIMITED RIGHT (NON-VASCULAR); Future -     US  SOFT TISSUE RT UPPER EXTREMITY LTD (NON-VASCULAR); Future -     US  PELVIS LIMITED (TRANSABDOMINAL ONLY); Future  Heel pain, bilateral  Healthcare maintenance  Follow-up examination     Getting Answers and Following Up  Simple Questions & Concerns: For quick questions or basic follow-up after your visit, reach us  at [phone] or MyChart messaging. Complex Concerns: If your concern is more complex, scheduling an appointment might be best. Discuss this with the staff to find the most suitable option. Lab & Imaging Results: We'll contact you directly if results are abnormal or you don't use MyChart. Most normal results will be on MyChart within 2-3 business days, with a review message from Dr. Boston Byers. Haven't heard back in 2 weeks? Need results sooner? Contact us  at [phone]. Referrals: Our referral coordinator will manage specialist referrals. The specialist's office should contact you within 2 weeks to schedule an appointment. Call us  if you haven't heard from them after 2 weeks.  Staying Connected  MyChart: Activate your MyChart for the fastest way to access results and message us . See the last page of this paperwork for instructions.  Billing  X-ray & Lab Orders: These are billed by separate companies. Contact the invoicing company directly for questions or concerns. Visit Charges: Discuss any billing inquiries with our administrative services team.  Feedback & Satisfaction  Share Your Experience: We strive for your satisfaction! If you have any complaints, please let Dr. Boston Byers know directly or contact our Practice Administrators, Olinda Bertrand  or [employer], by asking at the front desk.  Scheduling Tips  Shorter Wait Times: 8 am and 1 pm appointments often have the quickest wait times. Longer Appointments: If you need more time during your visit, talk to the front desk. Due to insurance regulations, multiple back-to-back appointments might be necessary.

## 2023-12-07 NOTE — Assessment & Plan Note (Signed)
 Assessment: Patient presents with multiple firm, non-tender nodules present for over a year, distributed across the right forehead, right lateral shin, forearm, and buttocks. The differential diagnosis includes benign lipomas, dermatofibromas, angiolipomas, erythema nodosum, sarcoidosis, and lymphoproliferative disorders. Given the widespread distribution and chronicity, a systematic evaluation is warranted to rule out systemic conditions. Plan: Soft tissue ultrasound of right lower and upper extremities to characterize nodules Pelvic ultrasound to evaluate nodules in buttock region Chest X-ray to evaluate for mediastinal lymphadenopathy or pulmonary involvement Complete urinalysis to screen for systemic inflammation Referral to dermatology for evaluation and possible biopsy Monitor for changes in size, number, or character of nodules Consider hematology/oncology referral pending initial results

## 2023-12-07 NOTE — Assessment & Plan Note (Signed)
 Assessment: Patient has established fatty liver disease with difficulty losing weight despite dietary modifications. Current BMI 40.75 kg/m. Weight has increased from 222 lbs in 2018 to current 237 lbs despite diet restrictions. March 2024 CT scan confirmed hepatic steatosis. Recent labs show mild glucose elevation (110) but otherwise normal liver function tests. Plan: Complete abdominal ultrasound to reassess liver status Comprehensive metabolic panel to monitor liver function FIB-4 with reflex to ELF to assess for fibrosis Additional liver workup: GGT, ferritin, alpha-1-antitrypsin, hepatitis panel, autoimmune markers Initiate tirzepatide (Zepbound) 2.5 mg subcutaneously weekly for weight management Continue dietary modifications with emphasis on Mediterranean diet pattern Advise alcohol abstinence Aim for 5-10% weight reduction within 6 months Regular monitoring of liver function tests

## 2023-12-07 NOTE — Assessment & Plan Note (Signed)
 Assessment: Patient reports hand pain, particularly in fingers, worse in morning with improvement throughout the day. Pattern suggests inflammatory arthritis. Plan: Bilateral hand X-rays to evaluate for degenerative changes, erosions, or other abnormalities Inflammatory markers: ESR and CRP to assess inflammatory activity Consider rheumatology referral pending lab and imaging results Trial of topical diclofenac or oral NSAIDs as needed for pain, after reviewing renal function Hand exercises and ergonomic adjustments as appropriate Reassess at follow-up visit

## 2023-12-07 NOTE — Assessment & Plan Note (Signed)
 Assessment: Patient reports nighttime sensation of "ants" in arms and legs, most consistent with neuropathy. Differential includes peripheral neuropathy, nerve compression syndromes, vitamin deficiencies, and medication side effects. Plan: CBC with differential to screen for anemia or other hematologic abnormalities Comprehensive metabolic panel to assess kidney function and electrolytes Consider vitamin B12, folate, and vitamin D levels if initial workup unrevealing Review and optimize diabetic control if applicable Assess sleep habits and consider sleep study if symptoms suggest sleep-related movement disorder Consider neurology referral if symptoms persist or worsen

## 2023-12-07 NOTE — Assessment & Plan Note (Signed)
>>  ASSESSMENT AND PLAN FOR ARTHRITIS OF BOTH HANDS WRITTEN ON 12/07/2023  2:44 PM BY Bethzy Hauck G, MD  Assessment: Patient reports hand pain, particularly in fingers, worse in morning with improvement throughout the day. Pattern suggests inflammatory arthritis. Plan: Bilateral hand X-rays to evaluate for degenerative changes, erosions, or other abnormalities Inflammatory markers: ESR and CRP to assess inflammatory activity Consider rheumatology referral pending lab and imaging results Trial of topical diclofenac or oral NSAIDs as needed for pain, after reviewing renal function Hand exercises and ergonomic adjustments as appropriate Reassess at follow-up visit

## 2023-12-07 NOTE — Assessment & Plan Note (Signed)
 Assessment: Reported abdominal swelling with concerns for possible ascites. March 2024 CT noted no significant ascites at that time. Plan: Complete abdominal ultrasound to assess for ascites and further evaluate liver Comprehensive metabolic panel with liver function tests Amylase and lipase to rule out pancreatic inflammation TSH with reflex to Free T4 to assess thyroid  function Assess for abdominal wall laxity vs fluid accumulation Correlate findings with liver assessment

## 2023-12-07 NOTE — Assessment & Plan Note (Signed)
 Assessment: BMI 40.75 kg/m with progressive weight gain despite dietary efforts. Obesity complicating multiple other conditions including fatty liver disease, possible joint pain, and increased cardiovascular risk. Plan: Initiate tirzepatide (Zepbound) 2.5 mg weekly for weight management Lipid panel and HbA1c to assess cardiometabolic risk Discuss structured physical activity program appropriate to current fitness level Consider nutrition consult for personalized meal planning Discuss reasonable weight loss goals (5-10% in 6 months) Monitor for medication side effects and efficacy

## 2023-12-07 NOTE — Assessment & Plan Note (Signed)
 Imaging ordered after shared decision making

## 2023-12-08 ENCOUNTER — Ambulatory Visit
Admission: RE | Admit: 2023-12-08 | Discharge: 2023-12-08 | Disposition: A | Discharge status: Home / Self Care | Source: Ambulatory Visit | Attending: Internal Medicine | Admitting: Internal Medicine

## 2023-12-08 DIAGNOSIS — R229 Localized swelling, mass and lump, unspecified: Secondary | ICD-10-CM

## 2023-12-08 DIAGNOSIS — K760 Fatty (change of) liver, not elsewhere classified: Secondary | ICD-10-CM

## 2023-12-08 DIAGNOSIS — R2231 Localized swelling, mass and lump, right upper limb: Secondary | ICD-10-CM | POA: Diagnosis not present

## 2023-12-08 DIAGNOSIS — R2241 Localized swelling, mass and lump, right lower limb: Secondary | ICD-10-CM | POA: Diagnosis not present

## 2023-12-08 DIAGNOSIS — R222 Localized swelling, mass and lump, trunk: Secondary | ICD-10-CM | POA: Diagnosis not present

## 2023-12-08 DIAGNOSIS — R1900 Intra-abdominal and pelvic swelling, mass and lump, unspecified site: Secondary | ICD-10-CM

## 2023-12-08 DIAGNOSIS — R591 Generalized enlarged lymph nodes: Secondary | ICD-10-CM

## 2023-12-09 ENCOUNTER — Encounter: Payer: Self-pay | Admitting: Internal Medicine

## 2023-12-09 DIAGNOSIS — R161 Splenomegaly, not elsewhere classified: Secondary | ICD-10-CM | POA: Insufficient documentation

## 2023-12-09 DIAGNOSIS — K760 Fatty (change of) liver, not elsewhere classified: Secondary | ICD-10-CM | POA: Insufficient documentation

## 2023-12-09 DIAGNOSIS — K7460 Unspecified cirrhosis of liver: Secondary | ICD-10-CM | POA: Insufficient documentation

## 2023-12-09 HISTORY — DX: Fatty (change of) liver, not elsewhere classified: K76.0

## 2023-12-09 LAB — ACUTE HEP PANEL AND HEP B SURFACE AB
HEPATITIS C ANTIBODY REFILL$(REFL): NONREACTIVE
Hep A IgM: NONREACTIVE
Hep B C IgM: NONREACTIVE
Hepatitis B Surface Ag: NONREACTIVE

## 2023-12-09 LAB — ANTI-MICROSOMAL ANTIBODY LIVER / KIDNEY: LKM1 Ab: <=20 U (ref <=20.0)

## 2023-12-09 LAB — URINALYSIS, COMPLETE
Bacteria, UA: NONE SEEN /HPF
Bilirubin Urine: NEGATIVE
Glucose, UA: NEGATIVE
Hgb urine dipstick: NEGATIVE
Hyaline Cast: NONE SEEN /LPF
Ketones, ur: NEGATIVE
Leukocytes,Ua: NEGATIVE
Nitrite: NEGATIVE
Protein, ur: NEGATIVE
RBC / HPF: NONE SEEN /HPF (ref 0–2)
Specific Gravity, Urine: 1.013 (ref 1.001–1.035)
WBC, UA: NONE SEEN /HPF (ref 0–5)
pH: 5.5 (ref 5.0–8.0)

## 2023-12-09 LAB — ALPHA-1-ANTITRYPSIN: A-1 Antitrypsin, Ser: 122 mg/dL (ref 83–199)

## 2023-12-09 LAB — FIB-4 W/REFLEX TO ELF
ALT: 41 IU/L — ABNORMAL HIGH (ref 0–32)
AST: 34 IU/L (ref 0–40)
FIB-4 Index: 1.48 (ref 0.00–2.67)
Platelets: 215 10*3/uL (ref 150–450)

## 2023-12-09 LAB — TSH RFX ON ABNORMAL TO FREE T4: TSH: 3.67 u[IU]/mL (ref 0.450–4.500)

## 2023-12-09 LAB — REFLEX TIQ

## 2023-12-09 LAB — ENHANCED LIVER FIBROSIS (ELF)

## 2023-12-09 NOTE — Addendum Note (Signed)
 Addended by: Zhamir Pirro G on: 12/09/2023 12:37 PM   Modules accepted: Orders

## 2023-12-12 NOTE — Telephone Encounter (Signed)
 Clinical questions have been answered and PA submitted. PA currently Pending.

## 2023-12-12 NOTE — Telephone Encounter (Signed)
 read by Jeanie Miller at 8:22AM on 12/11/2023.

## 2023-12-14 ENCOUNTER — Other Ambulatory Visit (HOSPITAL_COMMUNITY): Payer: Self-pay

## 2023-12-15 NOTE — Telephone Encounter (Signed)
 Pharmacy Patient Advocate Encounter  Received notification from [employer] that Prior Authorization for Zepbound  2.5MG /0.5ML pen-injectors has been DENIED.  Full denial letter will be uploaded to the media tab. See denial reason below.   PA #/Case ID/Reference #: [id number]

## 2023-12-17 ENCOUNTER — Ambulatory Visit: Payer: Self-pay | Admitting: Internal Medicine

## 2023-12-17 NOTE — Progress Notes (Signed)
 There is no evidence for soft tissue mass, cyst or abnormal vascularity in the dorsal area of the fourth metacarpal or region of palpable abnormality in the right index finger.  Overall this means its less concerning but doesn't tell us  much.  Follow up depends on whether it goes away, stays the same, or gets worse.

## 2023-12-18 ENCOUNTER — Ambulatory Visit (INDEPENDENT_AMBULATORY_CARE_PROVIDER_SITE_OTHER): Admitting: Internal Medicine

## 2023-12-18 VITALS — BP 126/76 | HR 87 | Temp 98.0°F | Ht 64.0 in | Wt 233.0 lb

## 2023-12-18 DIAGNOSIS — K760 Fatty (change of) liver, not elsewhere classified: Secondary | ICD-10-CM | POA: Diagnosis not present

## 2023-12-18 DIAGNOSIS — M79672 Pain in left foot: Secondary | ICD-10-CM

## 2023-12-18 DIAGNOSIS — M79671 Pain in right foot: Secondary | ICD-10-CM

## 2023-12-18 DIAGNOSIS — R519 Headache, unspecified: Secondary | ICD-10-CM | POA: Diagnosis not present

## 2023-12-18 DIAGNOSIS — Z0000 Encounter for general adult medical examination without abnormal findings: Secondary | ICD-10-CM

## 2023-12-18 DIAGNOSIS — Z8249 Family history of ischemic heart disease and other diseases of the circulatory system: Secondary | ICD-10-CM

## 2023-12-18 DIAGNOSIS — E785 Hyperlipidemia, unspecified: Secondary | ICD-10-CM | POA: Diagnosis not present

## 2023-12-18 DIAGNOSIS — G44039 Episodic paroxysmal hemicrania, not intractable: Secondary | ICD-10-CM

## 2023-12-18 MED ORDER — ROSUVASTATIN CALCIUM 20 MG PO TABS: 20.0000 mg | ORAL_TABLET | Freq: Every day | ORAL | 3 refills | Status: DC

## 2023-12-18 MED ORDER — TIRZEPATIDE-WEIGHT MANAGEMENT 2.5 MG/0.5ML ~~LOC~~ SOAJ: 2.5000 mg | SUBCUTANEOUS | 11 refills | Status: DC

## 2023-12-18 NOTE — Patient Instructions (Signed)
 Lifestyle efforts: Are you currently following any diet or exercise program to lose weight? If yes, what are you doing? (For example: "I'm on a low-carb diet and walking 30 minutes daily." If no, just answer "No.") Duration of efforts: Approximately how long have you been trying to lose weight through diet and exercise? (e.g. "6 months," "on and off for 2 years," etc.) Past programs & results: What weight-loss programs or methods have you tried in the past, and what were the results? (Examples: "Joined Weight Watchers for 6 months and lost 10 lbs, but regained it" or "Tried a gym routine for a year with minimal weight change.") Previous medications: Have you ever used any weight-loss medications (such as phentermine, Saxenda, etc.)? If yes, please list which ones, how long you took them, and why you stopped (e.g. "Tried phentermine for 3 months in 2021 but had side effects, so stopped"). If no, write "No." Health conditions: Do you have any health conditions related to your weight? For example: diabetes, high blood pressure, high cholesterol, sleep apnea, joint pain/arthritis, or others. (Please list all that apply to you, or say "None.") Current stats: What is your current weight? _______ (lbs or kg) & height? _______ (feet/inches or cm). (Approximate is okay if you're not sure. This helps us  calculate your BMI.) If you feel BMI doesn't portray your health well, please give us  your waist circumference and/or caliper measurements or even pictures displaying visceral fat. Additional info: Is there anything else about your health or weight-loss journey you want us  to know for the insurance appeal? (For instance, how your weight affects your daily life or why this medication is important to you. You can also mention if you're committed to maintaining diet and exercise along with the medication.) Thank you! Please reply to this message with your answers. You can number your responses 1 through 7 to match the  questions. Your detailed answers will help us  write a strong appeal letter to your insurance. Reference the below questionnaire to help jog your memory. But its best if you free text your experiences in detail.  GLP-1 Medication Authorization Questionnaire  This questionnaire helps your healthcare provider obtain insurance approval for GLP-1 weight-loss medication. Please answer as completely as possible. This information is for your medical record and insurance support; only you and your care team will see these answers.  BASIC INFORMATION  ?? Today's Date:  12/18/2023  ?? Current Weight (lbs):   Wt Readings from Last 10 Encounters:  12/18/23 233 lb (105.7 kg)  12/06/23 237 lb 6.4 oz (107.7 kg)  10/04/22 230 lb (104.3 kg)  01/01/20 230 lb 6.4 oz (104.5 kg)  05/31/19 228 lb 12.8 oz (103.8 kg)  05/23/19 228 lb (103.4 kg)  05/11/19 230 lb (104.3 kg)  08/02/17 225 lb 12.8 oz (102.4 kg)  07/31/17 225 lb (102.1 kg)  07/29/17 223 lb 2 oz (101.2 kg)    ?? Height (inches):   Ht Readings from Last 3 Encounters:  12/18/23 5\' 4"  (1.626 m)  12/06/23 5\' 4"  (1.626 m)  10/04/22 5\' 4"  (1.626 m)   ?? Most Recent A1c (%): Date:   Lab Results  Component Value Date   HGBA1C 5.8 12/06/2023    WEIGHT HISTORY  Please provide additional weight data from the past 2 years if available.  Wt Readings from Last 50 Encounters:  12/18/23 233 lb (105.7 kg)  12/06/23 237 lb 6.4 oz (107.7 kg)  10/04/22 230 lb (104.3 kg)  01/01/20 230 lb 6.4 oz (104.5  kg)  05/31/19 228 lb 12.8 oz (103.8 kg)  05/23/19 228 lb (103.4 kg)  05/11/19 230 lb (104.3 kg)  08/02/17 225 lb 12.8 oz (102.4 kg)  07/31/17 225 lb (102.1 kg)  07/29/17 223 lb 2 oz (101.2 kg)  06/16/17 222 lb (100.7 kg)  07/15/16 210 lb 3.2 oz (95.3 kg)  02/20/15 220 lb (99.8 kg)  03/04/14 222 lb (100.7 kg)    Please include explanation of the changes in our data:   DIET PROGRAMS  ?? Check any diet programs you have tried for weight loss:  Write in  details for your experience with the program and why it didn't work for you. ?    [] Low-carb/Keto diet [] Mediterranean diet [] Intermittent fasting [] Weight Watchers/WW [] Delight Felts [] Noom [] Calorie counting apps [] Nutrisystem [] Low-fat diet [] Plant-based/Vegan diet [] Meal replacement programs [] Medical weight management  ?? Provide details about your weight loss programs:    DIET CHALLENGES  ?? What factors limited the success or sustainability of these weight loss efforts?    [] Cost/affordability [] Time constraints [] Hunger/constant food cravings [] Difficulty maintaining long-term [] Health issues that interfered [] Lack of results despite effort [] Difficulty with food preparation [] Family/social challenges   Additional details about diet challenges:    PREVIOUS WEIGHT-LOSS MEDICATIONS  ?? Check any medications or supplements you have tried specifically for weight loss:   [] Phentermine (Adipex-P, Lomaira) [] Orlistat (Xenical, Alli) [] Qsymia (phentermine/topiramate) [] Contrave (naltrexone/bupropion) [] Saxenda (liraglutide) [] Wegovy (semaglutide) [] Zepbound  (tirzepatide ) [] Mounjaro  (tirzepatide ) [] Compounded GLP-1 medication [] OTC weight loss supplements  []  Other weight medication:  ?? Provide details about your weight loss medication experiences:    EXERCISE HISTORY  ?? Check any activities you currently do regularly (at least once per week):  [] Walking [] Running/jogging [] Cycling/stationary bike [] Swimming [] Weight training [] Group fitness classes [] Yoga/Pilates [] Home workout videos/apps [] Sports [] Other: []  I do not currently exercise    Current exercise frequency and duration:    EXERCISE LIMITATIONS  ?? Check any factors that limit your ability to exercise:  [] Joint/muscle pain [] Shortness of breath during activity [] Chronic fatigue/low energy [] Lack of time (work/family) [] No safe place to exercise [] Can't afford gym/equipment [] Physical  disability or condition [] Lack of motivation   Additional details about exercise limitations:    MEDICAL CONDITIONS  ?? Check any symptoms you currently experience that may be related to your weight:  [] Joint or muscle pain [] Shortness of breath with activity [] Persistent fatigue or low energy [] Difficulty with mobility  [] Sleep disturbances or snoring [] Heartburn/acid reflux [] Swelling in legs/ankles [] None of the above   ?? Check any conditions you have been diagnosed with:  Our records confirm you has Fatty liver; Benign essential hypertension; Lumbar pain; Acute lumbar myofascial strain; Musculoskeletal pain; DOE (dyspnea on exertion); Morbid obesity due to excess calories (HCC) complicated by HBP/LVH; Generalized lymphadenopathy; Abdominal swelling; Paresthesia of upper and lower extremities of both sides; Flexural eczema; Arthritis of both hands; Heel pain, bilateral; Subcutaneous nodule; Hashimoto thyroiditis; Dyslipidemia; Diverticula of colon; Prediabetes; Eosinophilia; Arthralgia of multiple joints; Arthritis; Daytime somnolence; Elevated liver enzymes; Heel pain; Hyperglycemia; Knee pain; Mild intermittent asthma without complication; Obesity with body mass index 30 or greater; Reactive airway disease; Seasonal allergies; Varicose veins of lower extremity; Splenomegaly; and Hepatic cirrhosis (HCC) on their problem list. [] Obesity or Overweight (BMI >= 25) [] Obstructive Sleep Apnea [] High Blood Pressure [] High Cholesterol or Triglycerides [] Heart Disease [] Polycystic Ovary Syndrome (PCOS) [] Insulin Resistance [] Metabolic Syndrome [] Prediabetes [] Type 2 Diabetes [] Fatty Liver Disease [] Depression or Anxiety [] Osteoarthritis or Joint Problems [] GERD [] None of the above    ?? Current Medications:  Charted:  Current Outpatient Medications on File Prior to Visit  Medication Sig   bisoprolol  (ZEBETA ) 5 MG tablet Take 1 tablet (5 mg total) by mouth daily.   mometasone   (ELOCON ) 0.1 % cream Apply as needed to skin rash/eczema   tirzepatide  (ZEPBOUND ) 2.5 MG/0.5ML Pen Inject 2.5 mg into the skin once a week.   No current facility-administered medications on file prior to visit.    Corrections to above:  ???? Family Medical History:  [] Obesity or weight issues [] Type 2 Diabetes [] High Blood Pressure [] High Cholesterol [] Heart Attack or Stroke [] Other Important:  [] None of the above    QUALITY OF LIFE IMPACT  ?? Check any areas where your weight affects your daily functioning:  [] Physical mobility [] Daily self-care [] Work performance [] Social activities/relationships [] Intimate relationships [] Participation in hobbies [] Travel/public seating [] Shopping for clothing [] Public dining [] No significant impact   ?? Check any emotional effects you experience related to your weight:  [] Low self-esteem/poor body image [] Depression or persistent sadness [] Anxiety or worry [] Social avoidance/isolation [] Feelings of shame/embarrassment [] Stress or emotional eating [] Feeling judged by others [] No significant emotional impact   ? Additional details about quality-of-life impact:   EATING PATTERNS  ?? Check any that apply to your typical eating patterns:  [] Skipping meals regularly [] Emotional eating or stress eating [] Eating quickly/while distracted [] Large portion sizes  [] Nighttime eating/late snacking [] Frequent restaurant/takeout meals [] Grazing throughout the day [] Significant hunger between meals [] Binge eating episodes  ?? Food Triggers or Preferences:   WEIGHT MANAGEMENT GOALS  ?? What is your current weight goal?  [] Lose 5-10% of current weight [] Lose a specific amount: lbs  [] Reach a specific weight: lbs  [] Prevent further weight gain [] Improve health markers regardless of weight [] Reduce visceral fat and improve body composition ?? What are your primary reasons for wanting to lose weight?  [] Improve health/manage  conditions [] Increase energy or stamina [] Reduce pain/improve mobility [] Improve appearance/fit into clothes [] Improve mental health/confidence [] Participate more fully in activities [] Doctor recommended weight loss [] Other: ?? Previous Healthcare Provider Discussions:    ADDITIONAL INFORMATION  Is there anything else you would like your healthcare provider and insurance company to know about your weight history, weight loss attempts, or health concerns?    PATIENT ACKNOWLEDGMENT  I confirm that the information provided in this questionnaire is accurate and complete to the best of my knowledge. I understand this information will be used to support my request for GLP-1 medication authorization. Patient Signature:                          Date:    -------------------------------------------------------------------------------------------- INSURANCE & AUTHORIZATION UNDERSTANDING  ?? Insurance Plan Information:  Insurance Provider:  Plan Type:  I have reviewed my insurance plan's coverage policy for weight management medications: Yes No Unsure  ?? Previous experience with insurance authorization:  Previous medication prior authorization (any type) Previous denial of weight-related treatment Successfully appealed insurance denial Familiar with insurance appeals process Currently work in healthcare/insurance Have resources to help with insurance issues ?? Financial considerations for treatment:  I would only proceed with treatment if insurance covers it I could pay out-of-pocket for a short time while appealing a denial I have researched manufacturer assistance programs I would like information about payment assistance options ?? Understanding of GLP-1 Authorization Requirements:  I understand insurance typically requires documented weight-related health conditions I understand insurance may require 3-6 months of documented diet/exercise attempts I understand insurance may require  trial of other weight-loss medications first I understand GLP-1  approval often requires specific BMI thresholds (typically >=30, or >=27 with conditions) I understand insurance may require periodic documentation of weight loss to continue coverage ?? Advocacy & Documentation Readiness:  I have gathered medical records documenting weight-related health issues I have documentation of previous weight loss attempts (photos, apps, program receipts) I have a support person who can help me navigate insurance issues if needed I am comfortable contacting my insurance company to discuss coverage I am willing to file appeals if initially denied ?? Timeline & Expectations:  When would you hope to begin GLP-1 treatment?  I understand authorization process may take 2-8 weeks: Yes No  I understand approvals typically require renewal every 3-12 months: Yes No

## 2023-12-18 NOTE — Progress Notes (Signed)
 ==============================  [facility]: [phone]   -- Medical Office Visit --  Patient: Patricia Gross      Age: 58 y.o.       Sex:  female  Date:   12/18/2023 Today's Healthcare Provider: Anthon Kins, MD  ==============================   Chief Complaint: Follow-up, Headache (Pain over the top of right eye at forehead off and on and over to the side of head would like to have ct or MRI if possible. ), and Foot Pain (Pt having heel pain on both feet.)  Discussed the use of AI scribe software for clinical note transcription with the patient, who gave verbal consent to proceed. History of Present Illness  58 year old female who presents with elevated liver function tests and right eye pain. She is accompanied by her daughter.  She experiences intermittent pain over the top of her right eye and forehead, describing it as a 'little ball' sensation when touched. The discomfort has persisted for one to two weeks, with episodes lasting a couple of hours before subsiding and then recurring. No changes in vision or jaw pain associated with chewing. There is a family history of aneurysms, as her mother and aunt both died from aneurysms, raising concern about the possibility of an aneurysm. She reports sensitivity to light and occasional nausea, suggesting possible migraines.  She has elevated liver function tests, specifically ALT and AST. A liver ultrasound showed an enlarged spleen. Her platelet count is currently sufficient. She is on a strict diet and exercise regimen to address her liver health and weight. She has a history of elevated triglycerides, with recent levels around 230 mg/dL.  She also reports heel pain in both feet, which she considers less urgent compared to her liver and eye issues.  Background Reviewed: Problem List: has Fatty liver; Benign essential hypertension; Lumbar pain; Acute lumbar myofascial strain; Musculoskeletal pain; DOE  (dyspnea on exertion); Morbid obesity due to excess calories (HCC) complicated by HBP/LVH; Generalized lymphadenopathy; Abdominal swelling; Paresthesia of upper and lower extremities of both sides; Flexural eczema; Arthritis of both hands; Heel pain, bilateral; Subcutaneous nodule; Hashimoto thyroiditis; Dyslipidemia; Diverticula of colon; Prediabetes; Eosinophilia; Arthralgia of multiple joints; Arthritis; Daytime somnolence; Elevated liver enzymes; Heel pain; Hyperglycemia; Knee pain; Mild intermittent asthma without complication; Obesity with body mass index 30 or greater; Reactive airway disease; Seasonal allergies; Varicose veins of lower extremity; Splenomegaly; and Hepatic cirrhosis (HCC) on their problem list. Past Medical History:  has a past medical history of Arthritis, Asthma, Blood in urine (07/29/2017), Chronic pain, Hematuria (07/29/2017), Hypertension, Insulin resistance, Left lower quadrant pain (07/29/2017), Sleep apnea, Steatosis of liver (12/09/2023), and Vitamin D deficiency. Past Surgical History:   has a past surgical history that includes Gallbladder surgery and Cholecystectomy. Social History:   reports that she has never smoked. She has never used smokeless tobacco. She reports that she does not drink alcohol and does not use drugs. Family History:  family history includes Alcohol abuse in her father; Aneurysm in her mother. Allergies:  is allergic to lisinopril.   Medication Reconciliation: Current Outpatient Medications on File Prior to Visit  Medication Sig   bisoprolol  (ZEBETA ) 5 MG tablet Take 1 tablet (5 mg total) by mouth daily.   mometasone  (ELOCON ) 0.1 % cream Apply as needed to skin rash/eczema   No current facility-administered medications on file prior to visit.   Medications Discontinued During This Encounter  Medication Reason   tirzepatide  (ZEPBOUND ) 2.5 MG/0.5ML Pen Reorder  Physical Exam:    12/18/2023    3:37 PM 12/06/2023    8:59 AM 10/04/2022     8:30 AM  Vitals with BMI  Height 5\' 4"  5\' 4"    Weight 233 lbs 237 lbs 6 oz   BMI 39.97 40.73   Systolic 126 122 161  Diastolic 76 78 97  Pulse 87 72 56  Vital signs reviewed.  Nursing notes reviewed. Weight trend reviewed. Physical Exam General Appearance:  No acute distress appreciable.   Well-groomed, healthy-appearing female.  Well proportioned with no abnormal fat distribution.  Good muscle tone. Pulmonary:  Normal work of breathing at rest, no respiratory distress apparent. SpO2: 98 %  Musculoskeletal: All extremities are intact.  Neurological:  Awake, alert, oriented, and engaged.  No obvious focal neurological deficits or cognitive impairments.  Sensorium seems unclouded.   Speech is clear and coherent with logical content. Psychiatric:  Appropriate mood, pleasant and cooperative demeanor, thoughtful and engaged during the exam Physical Exam        No results found for any visits on 12/18/23. Office Visit on 12/06/2023  Component Date Value   Cholesterol 12/06/2023 188    Triglycerides 12/06/2023 251.0 (H)    HDL 12/06/2023 36.30 (L)    VLDL 12/06/2023 50.2 (H)    LDL Cholesterol 12/06/2023 101 (H)    Total CHOL/HDL Ratio 12/06/2023 5    NonHDL 12/06/2023 151.64    Sodium 12/06/2023 139    Potassium 12/06/2023 4.3    Chloride 12/06/2023 104    CO2 12/06/2023 26    Glucose, Bld 12/06/2023 108 (H)    BUN 12/06/2023 13    Creatinine, Ser 12/06/2023 0.61    Total Bilirubin 12/06/2023 0.6    Alkaline Phosphatase 12/06/2023 68    AST 12/06/2023 30    ALT 12/06/2023 37 (H)    Total Protein 12/06/2023 7.2    Albumin 12/06/2023 4.6    GFR 12/06/2023 97.35    Calcium  12/06/2023 9.8    TSH 12/06/2023 3.670    Hgb A1c MFr Bld 12/06/2023 5.8    Sed Rate 12/06/2023 19    CRP 12/06/2023 <1.0    Anti Nuclear Antibody (A* 12/06/2023 Negative    AST 12/06/2023 34    ALT 12/06/2023 41 (H)    Platelets 12/06/2023 215    FIB-4 Index 12/06/2023 1.48    GGT 12/06/2023 49     Hep A IgM 12/06/2023 NON-REACTIVE    Hepatitis B Surface Ag 12/06/2023 NON-REACTIVE    Hep B C IgM 12/06/2023 NON-REACTIVE    HEPATITIS C ANTIBODY REF* 12/06/2023 NON-REACTIVE    A-1 Antitrypsin, Ser 12/06/2023 122    Ferritin 12/06/2023 152.5    LKM1 Ab 12/06/2023 <=20.0    Smooth Muscle Ab 12/06/2023 12    Mitochondrial Ab 12/06/2023 <20.0    Amylase 12/06/2023 35    Lipase 12/06/2023 35.0    Color, Urine 12/06/2023 YELLOW    APPearance 12/06/2023 CLEAR    Specific Gravity, Urine 12/06/2023 1.013    pH 12/06/2023 5.5    Glucose, UA 12/06/2023 NEGATIVE    Bilirubin Urine 12/06/2023 NEGATIVE    Ketones, ur 12/06/2023 NEGATIVE    Hgb urine dipstick 12/06/2023 NEGATIVE    Protein, ur 12/06/2023 NEGATIVE    Nitrite 12/06/2023 NEGATIVE    Leukocytes,Ua 12/06/2023 NEGATIVE    WBC, UA 12/06/2023 NONE SEEN    RBC / HPF 12/06/2023 NONE SEEN    Squamous Epithelial / HPF 12/06/2023 0-5    Bacteria, UA 12/06/2023 NONE SEEN  Hyaline Cast 12/06/2023 NONE SEEN    Note 12/06/2023     WBC 12/06/2023 5.8    RBC 12/06/2023 5.12 (H)    Hemoglobin 12/06/2023 14.5    HCT 12/06/2023 44.3    MCV 12/06/2023 86.5    MCHC 12/06/2023 32.8    RDW 12/06/2023 14.2    Platelets 12/06/2023 198.0    Neutrophils Relative % 12/06/2023 51.1    Lymphocytes Relative 12/06/2023 34.8    Monocytes Relative 12/06/2023 5.9    Eosinophils Relative 12/06/2023 7.5 (H)    Basophils Relative 12/06/2023 0.7    Neutro Abs 12/06/2023 2.9    Lymphs Abs 12/06/2023 2.0    Monocytes Absolute 12/06/2023 0.3    Eosinophils Absolute 12/06/2023 0.4    Basophils Absolute 12/06/2023 0.0    REFLEX TIQ 12/06/2023     ELF(TM) Score 12/06/2023 CANCELED   No image results found. US  SOFT TISSUE RT UPPER EXTREMITY LTD (NON-VASCULAR) Result Date: 12/16/2023 CLINICAL DATA:  Lump felt in the right index finger and dorsum of the hand. EXAM: ULTRASOUND RIGHT UPPER EXTREMITY LIMITED TECHNIQUE: Ultrasound examination of the upper  extremity soft tissues was performed in the area of clinical concern. COMPARISON:  None Available. FINDINGS: There is no evidence for soft tissue mass, cyst or abnormal vascularity in the dorsal area of the fourth metacarpal or region of palpable abnormality in the right index finger. IMPRESSION: No sonographic abnormality in the region of palpable abnormality in the right index finger or dorsal area of the fourth metacarpal. Electronically Signed   By: Tyron Gallon M.D.   On: 12/16/2023 22:24   US  SOFT TISSUE LOWER EXTREMITY LIMITED RIGHT (NON-VASCULAR) Result Date: 12/16/2023 CLINICAL DATA:  Right shin nodules. EXAM: ULTRASOUND RIGHT LOWER EXTREMITY LIMITED TECHNIQUE: Ultrasound examination of the lower extremity soft tissues was performed in the area of clinical concern. COMPARISON:  None Available. FINDINGS: There is no evidence for soft tissue mass, cyst or abnormal vascularity in the soft tissues of the right shin/area of palpable abnormality. IMPRESSION: No sonographic abnormality in the soft tissues of the right shin/area of palpable abnormality. Electronically Signed   By: Tyron Gallon M.D.   On: 12/16/2023 22:23   US  Pelvis Limited Result Date: 12/16/2023 CLINICAL DATA:  Subcutaneous nodules in the gluteal regions. EXAM: US  PELVIS LIMITED TECHNIQUE: Grayscale sonographic imaging of the soft tissues of the bilateral gluteal regions. COMPARISON:  None Available. FINDINGS: No evidence for soft tissue mass, cyst or abnormal vascularity in the region of the bilateral buttocks (region of concern). IMPRESSION: No sonographic abnormality in the region of concern. Electronically Signed   By: Tyron Gallon M.D.   On: 12/16/2023 21:44   US  Abdomen Complete Result Date: 12/08/2023 CLINICAL DATA:  Fatty liver. EXAM: ABDOMEN ULTRASOUND COMPLETE COMPARISON:  Renal CT October 04, 2022 FINDINGS: Gallbladder: Surgically removed. Common bile duct: Diameter: 7 mm, dilated, postsurgical in nature. Liver: No focal  lesion. Increased echotexture with nodular contour. Portal vein is patent on color Doppler imaging with normal direction of blood flow towards the liver. IVC: Not well seen per ultrasound technologist. Pancreas: Not well seen per ultrasound technologist. Spleen: Normal echotexture. The spleen measures 14.1 x 12.1 x 5.6 cm with volume of 498.4 cc. Right Kidney: Length: 13.1 cm. Echogenicity within normal limits. No mass or hydronephrosis visualized. Left Kidney: Length: 11.6 cm. Echogenicity within normal limits. No mass or hydronephrosis visualized. Abdominal aorta: No aneurysm visualized. Other findings: None. IMPRESSION: 1. Cirrhotic liver. No focal liver lesion identified. 2. Splenomegaly. Electronically Signed  By: Anna Barnes M.D.   On: 12/08/2023 11:04   DG Hand Complete Left Result Date: 12/06/2023 CLINICAL DATA:  Left hand pain for 1 year. EXAM: LEFT HAND - COMPLETE 3+ VIEW COMPARISON:  None Available. FINDINGS: Neutral ulnar variance. Mild thumb carpometacarpal joint space narrowing, subchondral sclerosis, and peripheral osteophytosis. Mild to moderate second through fifth DIP joint space narrowing and peripheral osteophytosis, greatest within the second digit. Mild second through fifth PIP joint space narrowing and peripheral osteophytosis. Mild distal radial styloid degenerative spurring. No acute fracture or dislocation. No cortical erosion or periostitis. 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 Electronically Signed   By: Bertina Broccoli M.D.   On: 12/06/2023 15:07   DG Hand Complete Right Result Date: 12/06/2023 CLINICAL DATA:  Bilateral hand pain for 1 year.  No known injury. EXAM: RIGHT HAND - COMPLETE 3+ VIEW COMPARISON:  None Available. FINDINGS: 2 mm ulnar negative variance. Minimal thumb carpometacarpal joint space narrowing, subchondral sclerosis, and peripheral osteophytosis. Mild-to-moderate second through fifth DIP and fifth  PIP joint space narrowing and peripheral osteophytosis. There is a 3 mm chronic mineralized density dorsal to the distal aspect of the proximal phalanx of the fifth finger, nonspecific. Mild radiocarpal joint space narrowing. No acute fracture or dislocation. No cortical erosion or periostitis. IMPRESSION: 1. Mild-to-moderate second through fifth DIP and fifth PIP osteoarthritis. 2. Mild radiocarpal and minimal thumb carpometacarpal osteoarthritis. Electronically Signed   By: Bertina Broccoli M.D.   On: 12/06/2023 15:04   DG Chest 2 View Result Date: 12/06/2023 CLINICAL DATA:  Generalized lymphadenopathy.  Shortness of breath. EXAM: CHEST - 2 VIEW COMPARISON:  Chest radiograph 05/13/2019 FINDINGS: Normal heart size and mediastinal contours. No abnormal mediastinal contours to suggest adenopathy by radiograph. Stable mild interstitial thickening. No focal airspace disease, significant pleural effusion or pneumothorax. Normal pulmonary vasculature. Thoracic spondylosis. IMPRESSION: 1. No acute findings. 2. Stable mild interstitial thickening. 3. No radiographic evidence of adenopathy. Electronically Signed   By: Chadwick Colonel M.D.   On: 12/06/2023 13:07   DG Os Calcis Right Result Date: 12/06/2023 CLINICAL DATA:  Heel pain. EXAM: RIGHT OS CALCIS - 2+ VIEW COMPARISON:  None Available. FINDINGS: Moderate to large fragmented Achilles tendon enthesophyte. Moderate plantar calcaneal spur. There are multiple soft tissue calcifications in the plantar region in the region of the plantar fascia, largest 6 mm. No evidence of fracture, erosion or focal bone abnormality. Normal subtalar alignment. IMPRESSION: 1. Moderate to large fragmented Achilles tendon enthesophyte. 2. Moderate plantar calcaneal spur. Multiple soft tissue calcifications in the region of the plantar fascia. Electronically Signed   By: Chadwick Colonel M.D.   On: 12/06/2023 13:06   DG Os Calcis Left Result Date: 12/06/2023 CLINICAL DATA:  Left heel pain.  EXAM: LEFT OS CALCIS - 2+ VIEW COMPARISON:  None Available. FINDINGS: Moderate Achilles tendon enthesophyte. Moderate plantar calcaneal spur multiple soft tissue calcifications in the region of the plantar fascia, largest measuring 12 mm. No evidence of fracture, erosion or focal bone abnormality. Normal subtalar alignment. IMPRESSION: 1. Moderate plantar calcaneal spur with multiple soft tissue calcifications in the region of the plantar fascia. 2. Moderate Achilles tendon enthesophyte. Electronically Signed   By: Chadwick Colonel M.D.   On: 12/06/2023 13:05        12/06/2023    9:12 AM 08/02/2017    5:27 PM 07/29/2017    9:59 AM 06/16/2017    9:34 AM  PHQ 2/9 Scores  PHQ - 2  Score 0 0 0 0  PHQ- 9 Score 0      Results LABS ALT: elevated AST: elevated Triglycerides: 230 Fib-4 score: 1.48  RADIOLOGY Liver ultrasound: splenomegaly    Assessment & Plan Fatty liver Mildly active liver disease is indicated by elevated ALT and AST, suggesting minimal damage, likely due to fatty liver disease. The liver remains resilient, and current damage is not severe. Splenomegaly is present due to back pressure from the liver, but platelet levels are adequate, posing no significant bleeding risk. She is at increased risk for hepatitis, with unknown vaccination status. The fib-4 score of 1.48 indicates a low to moderate risk of advanced liver disease. Order blood work to check hepatitis A and B immunity. Recommend weight loss through diet and exercise to potentially reverse liver damage. Discuss the potential for compounded medication if insurance does not cover prescribed medication. Order a liver biopsy if indicated by hematology. Provide educational materials on cholesterol management and liver disease. Hyperlipidemia, unspecified hyperlipidemia type Triglyceride levels are elevated at 230 mg/dL. Diet and exercise can significantly reduce these levels. Medications are not generally recommended unless  triglycerides exceed 500 mg/dL. Statins may be used to lower LDL cholesterol, which is more likely to cause cardiovascular events if not managed. Statins may slightly impact the liver but are generally beneficial for cardiovascular health. The ten-year risk of a heart attack or stroke is 5.4%, and while statins are not strongly recommended by guidelines, they can be considered. Prescribe rosuvastatin  (Crestor ) for LDL management. Recommend diet and exercise to lower triglycerides. Discuss potential side effects of rosuvastatin , including myalgia. Morbid obesity due to excess calories (HCC) complicated by HBP/LVH  Episodic paroxysmal hemicrania, not intractable Intermittent pain over the right eye and forehead has persisted for 1-2 weeks. Differential diagnosis includes temporal arteritis, temporal tendonitis, stress, anxiety, migraines, and tension headaches. Absence of vision changes or jaw pain makes temporal arteritis less likely. A family history of aneurysms is noted, and an MR angiogram is recommended to rule out aneurysms. Order MR angiogram with contrast due to family history aneurysms. Refer to neurology for further evaluation. Temporal headache Intermittent pain over the right eye and forehead has persisted for 1-2 weeks. Differential diagnosis includes temporal arteritis, temporal tendonitis, stress, anxiety, migraines, and tension headaches. Absence of vision changes or jaw pain makes temporal arteritis less likely. A family history of aneurysms is noted, and an MR angiogram is recommended to rule out aneurysms. Order MR angiogram with contrast. Refer to neurology for further evaluation.  Check ESR/crp but doesn't seem like temporal arteritis no jaw claudication or visual changes.  It s not involving eye at all so doubt glaucoma. Family history of arterial aneurysm Intermittent pain over the right eye and forehead has persisted for 1-2 weeks. Differential diagnosis includes temporal arteritis,  temporal tendonitis, stress, anxiety, migraines, and tension headaches. Absence of vision changes or jaw pain makes temporal arteritis less likely. A family history of aneurysms is noted, and an MR angiogram is recommended to rule out aneurysms. Order MR angiogram with contrast. Refer to neurology for further evaluation. Healthcare maintenance  Heel pain, bilateral Bilateral heel pain is the least concerning issue. Typically, patients are referred to a podiatrist for further evaluation and management, including potential injections or treatment for bone spurs. Consider referral to podiatry for further evaluation and management.  Recording duration: 31 minutes        Orders Placed During this Encounter:   Orders Placed This Encounter  Procedures   MR ANGIO HEAD  WO CONTRAST    AET[employer] EPIC ORDER WT:233 HT:5'5 NO NEEDS/ CLAUS/ NO METAL REMOVED/ NO IMPLANTS/ NO BULLETS OR BB'S/ NO GLUCOSE MONITOR,  STIMULATOR OR INJECTORS DEFIBRULATOR NO PORT NO BRAIN CLIP /NO IRON FX THERAPY/ NO PREV SX/ NO BRAIN HEART EYE OR EAR SX DP AND PT 12-19-2023 ** PT AWARE OF 75$ NO SHOW FEE**    Standing Status:   Future    Expiration Date:   12/17/2024    What is the patient's sedation requirement?:   No Sedation    Does the patient have a pacemaker or implanted devices?:   No    Preferred imaging location?:   [facility] (table limit-550lbs)   Hepatitis B surface antibody,quantitative   Hepatitis A antibody, total   Sedimentation rate   C-reactive protein   Gamma GT   Ferritin   Ambulatory referral to Ophthalmology    Referral Priority:   Routine    Referral Type:   Consultation    Referral Reason:   Specialty Services Required    Requested Specialty:   Ophthalmology    Number of Visits Requested:   1   Ambulatory referral to Neurology    Referral Priority:   Routine    Referral Type:   Consultation    Referral Reason:   Specialty Services Required    Requested Specialty:   Neurology    Ambulatory referral to Podiatry    Referral Priority:   Routine    Referral Type:   Consultation    Referral Reason:   Specialty Services Required    Requested Specialty:   Podiatry    Number of Visits Requested:   1   Meds ordered this encounter  Medications   rosuvastatin  (CRESTOR ) 20 MG tablet    Sig: Take 1 tablet (20 mg total) by mouth daily.    Dispense:  90 tablet    Refill:  3   tirzepatide  (ZEPBOUND ) 2.5 MG/0.5ML Pen    Sig: Inject 2.5 mg into the skin once a week.    Dispense:  2 mL    Refill:  11       This document was synthesized by artificial intelligence ([employer]bridge) using HIPAA-compliant recording of the clinical interaction;   We discussed the use of AI scribe software for clinical note transcription with the patient, who gave verbal consent to proceed. additional Info: This encounter employed state-of-the-art, real-time, collaborative documentation. The patient actively reviewed and assisted in updating their electronic medical record on a shared screen, ensuring transparency and facilitating joint problem-solving for the problem list, overview, and plan. This approach promotes accurate, informed care. The treatment plan was discussed and reviewed in detail, including medication safety, potential side effects, and all patient questions. We confirmed understanding and comfort with the plan. Follow-up instructions were established, including contacting the office for any concerns, returning if symptoms worsen, persist, or new symptoms develop, and precautions for potential emergency department visits.

## 2023-12-19 ENCOUNTER — Encounter: Payer: Self-pay | Admitting: Internal Medicine

## 2023-12-19 NOTE — Assessment & Plan Note (Signed)
 Bilateral heel pain is the least concerning issue. Typically, patients are referred to a podiatrist for further evaluation and management, including potential injections or treatment for bone spurs. Consider referral to podiatry for further evaluation and management.

## 2023-12-19 NOTE — Assessment & Plan Note (Signed)
 Mildly active liver disease is indicated by elevated ALT and AST, suggesting minimal damage, likely due to fatty liver disease. The liver remains resilient, and current damage is not severe. Splenomegaly is present due to back pressure from the liver, but platelet levels are adequate, posing no significant bleeding risk. She is at increased risk for hepatitis, with unknown vaccination status. The fib-4 score of 1.48 indicates a low to moderate risk of advanced liver disease. Order blood work to check hepatitis A and B immunity. Recommend weight loss through diet and exercise to potentially reverse liver damage. Discuss the potential for compounded medication if insurance does not cover prescribed medication. Order a liver biopsy if indicated by hematology. Provide educational materials on cholesterol management and liver disease.

## 2023-12-20 ENCOUNTER — Other Ambulatory Visit (HOSPITAL_COMMUNITY): Payer: Self-pay

## 2023-12-20 ENCOUNTER — Telehealth: Payer: Self-pay

## 2023-12-20 NOTE — Telephone Encounter (Signed)
 Pharmacy Patient Advocate Encounter   Received notification from CoverMyMeds that prior authorization for Zepbound  2.5MG /0.5ML pen-injectors  is required/requested.   Insurance verification completed.   The patient is insured through [employer] .   Per test claim: PA required; PA submitted to above mentioned insurance via CoverMyMeds Key/confirmation #/EOC [id number] Status is pending

## 2023-12-20 NOTE — Telephone Encounter (Signed)
 Pharmacy Patient Advocate Encounter   Received notification from CoverMyMeds that prior authorization for Zepbound  2.5MG /0.5ML pen-injectors is required/requested.   Insurance verification completed.   The patient is insured through [employer] [facility] .   Per test claim: PA required; PA started via CoverMyMeds. KEY [id number] . Waiting for clinical questions to populate.

## 2023-12-26 NOTE — Telephone Encounter (Signed)
 Pharmacy Patient Advocate Encounter  Received notification from [employer] that Prior Authorization for Zepbound  2.5MG /0.5ML pen-injectors has been DENIED.  See denial reason below. No denial letter attached in CMM. Will attach denial letter to Media tab once received.   PA #/Case ID/Reference #: [id number]

## 2023-12-27 ENCOUNTER — Ambulatory Visit: Admitting: Internal Medicine

## 2023-12-30 ENCOUNTER — Ambulatory Visit
Admission: RE | Admit: 2023-12-30 | Discharge: 2023-12-30 | Disposition: A | Discharge status: Home / Self Care | Source: Ambulatory Visit | Attending: Internal Medicine | Admitting: Internal Medicine

## 2023-12-30 DIAGNOSIS — R519 Headache, unspecified: Secondary | ICD-10-CM

## 2023-12-30 DIAGNOSIS — Z8249 Family history of ischemic heart disease and other diseases of the circulatory system: Secondary | ICD-10-CM

## 2023-12-30 DIAGNOSIS — G44039 Episodic paroxysmal hemicrania, not intractable: Secondary | ICD-10-CM

## 2024-01-04 ENCOUNTER — Ambulatory Visit: Admitting: Dermatology

## 2024-01-08 ENCOUNTER — Ambulatory Visit: Admitting: Internal Medicine

## 2024-01-26 ENCOUNTER — Encounter: Payer: Self-pay | Admitting: Internal Medicine

## 2024-01-29 ENCOUNTER — Ambulatory Visit (INDEPENDENT_AMBULATORY_CARE_PROVIDER_SITE_OTHER): Admitting: Internal Medicine

## 2024-01-29 ENCOUNTER — Encounter: Payer: Self-pay | Admitting: Internal Medicine

## 2024-01-29 VITALS — BP 148/96 | HR 66 | Temp 97.7°F | Ht 64.0 in | Wt 222.8 lb

## 2024-01-29 DIAGNOSIS — K7460 Unspecified cirrhosis of liver: Secondary | ICD-10-CM

## 2024-01-29 DIAGNOSIS — R188 Other ascites: Secondary | ICD-10-CM | POA: Diagnosis not present

## 2024-01-29 DIAGNOSIS — M1990 Unspecified osteoarthritis, unspecified site: Secondary | ICD-10-CM | POA: Diagnosis not present

## 2024-01-29 DIAGNOSIS — E669 Obesity, unspecified: Secondary | ICD-10-CM | POA: Diagnosis not present

## 2024-01-29 DIAGNOSIS — Z8249 Family history of ischemic heart disease and other diseases of the circulatory system: Secondary | ICD-10-CM

## 2024-01-29 DIAGNOSIS — I10 Essential (primary) hypertension: Secondary | ICD-10-CM

## 2024-01-29 MED ORDER — OLMESARTAN MEDOXOMIL 20 MG PO TABS
20.0000 mg | ORAL_TABLET | Freq: Every day | ORAL | 3 refills | Status: DC
Start: 2024-01-29 — End: 2024-02-29

## 2024-01-29 NOTE — Assessment & Plan Note (Signed)
 Resolvign with diet imps

## 2024-01-29 NOTE — Assessment & Plan Note (Signed)
 She has lost 10 pounds and is motivated to continue weight loss through diet and exercise. Tirzepatide  is not used due to insurance denial. She follows a low sugar, high protein diet and plans to incorporate muscle-building exercises. Emphasize protein intake to prevent muscle loss during weight reduction. Continue current diet and exercise regimen and provide a dietary list focusing on low sugar, high protein foods.

## 2024-01-29 NOTE — Patient Instructions (Addendum)
 VISIT SUMMARY:  Today, you came in for a follow-up on your chronic medical conditions, including hypertension, liver health, weight management, and concerns about brain aneurysms. You have made significant dietary changes and lost ten pounds. We discussed your current medications, dietary habits, and family medical history.  YOUR PLAN:  -HYPERTENSION: Hypertension means high blood pressure. Your current medication, bisoprolol , is not effectively controlling your blood pressure. We will switch you to olmesartan, starting with 10 mg daily and increasing to 20 mg after one week. We will also do blood work in one week to ensure you tolerate the new medication and to check if you can safely have a CT scan with contrast dye.  -FATTY LIVER: Fatty liver is a condition where fat builds up in the liver. Although you are not experiencing liver pain, we will order an MRI with sedation to rule out the possibility of liver cancer due to your concerns.  -WEIGHT MANAGEMENT: You have successfully lost 10 pounds through dietary changes and are motivated to continue. You are following a high-protein, low-sugar diet and plan to add strength training exercises. Continue with your current diet and exercise plan, focusing on protein intake to maintain muscle mass.  -FAMILY HISTORY OF BRAIN ANEURYSMS: A brain aneurysm is a bulge in a blood vessel in the brain that can be serious if it bursts. Given your family history and recent headaches, we will order an MRI of the brain with sedation to check for any aneurysms.  INSTRUCTIONS:  Please discontinue bisoprolol  and start taking olmesartan as prescribed. Return in one week for blood work to check your tolerance to olmesartan and suitability for a CT scan with contrast dye. We will also schedule MRIs for your liver and brain with sedation. Continue with your current diet and exercise regimen, focusing on high-protein, low-sugar foods. Here's a complete list of food options  categorized by type:  Proteins (Low Sugar, High Protein) Chicken breast (skinless) [place] breast Eggs and egg whites Canned tuna or salmon (in water) Lean cuts of beef (sirloin, round steak) Pork loin or tenderloin Greek yogurt (plain, unsweetened) Cottage cheese (low-fat or fat-free) Tofu (firm or extra firm) Tempeh Legumes (lentils, black beans, chickpeas) Low-fat cheese (mozzarella, cheddar) Edamame (frozen soybeans) Protein powders (whey isolate, pea protein) - check for no added sugars  Vegetables (Low Sugar, Nutrient-Dense) Spinach Kale Broccoli Cauliflower Zucchini Bell peppers Green beans Asparagus Cucumber Mushrooms Brussels sprouts Celery  Healthy Carbohydrates (Low Sugar, Moderate Glycemic)- ok every now and then Sweet potatoes (in moderation) Quinoa Brown rice Oats (unsweetened, steel-cut or rolled) Whole wheat pasta or bread (check label for low sugar) Beans and legumes (also a protein source) Lentils  Fats (Healthy, Satiety-Boosting)- not critical unless hunger pangs. Avocado Olive oil (extra virgin) Nuts (almonds, walnuts, peanuts in moderation) Seeds (chia, flax, pumpkin, sunflower) Natural nut butters (no added sugar)  Beverages Water (plain or infused with lemon, cucumber) Unsweetened green or black tea Coffee (black or with small amount of milk or unsweetened milk alternative)  Snacks Hard-boiled eggs [place] yogurt with nuts or seeds Raw veggies with hummus Cottage cheese with cucumber slices Roasted chickpeas Beef jerky (low sugar, check label)  Additional Tips Avoid processed foods with hidden sugars (read labels carefully) Use herbs, spices, lemon, vinegar for flavor without sugar Portion control is key to maintaining muscle during weight loss    ?? Trans Fats: What You Need to Know (and How to Avoid Them) Protect Your Heart, Brain, and Overall Health  ? What Are Trans Fats?  Trans fats are a type of unhealthy fat that can  increase your risk of: Heart disease Stroke Type 2 diabetes Inflammation Memory problems They are artificially made through a process called hydrogenation and were once common in processed foods for better shelf life and texture.  ?? Why Should I Avoid Trans Fats? Even small amounts of trans fats can: Raise "bad" LDL cholesterol Lower "good" HDL cholesterol Cause inflammation in your blood vessels Increase your risk of heart attack or stroke There is no safe level of artificial trans fat.  ?? How to Spot Trans Fats (Even When the Label Says "0g") Food companies can legally say "0 grams trans fat" if the product contains less than 0.5 grams per serving -- but that can add up fast! Look at the ingredients list for these clues: ?? Partially hydrogenated oil ? this means trans fat is present. ? Avoid foods with "shortening" or "hydrogenated" oils.  ?? Common Foods That May Contain Trans Fats Even today, you may find trans fats in: Baked goods (cookies, cakes, pies) Microwave popcorn Crackers Margarine and shortening Fried fast foods Frozen pizza  ? Healthier Choices Choose products with 0g trans fat and no "partially hydrogenated oil" in the ingredients. Use olive oil, avocado oil, or canola oil for cooking. Eat more whole, unprocessed foods: fruits, vegetables, whole grains, and lean proteins. Choose baked over fried, and fresh over packaged.  ?? Takeaway Message Trans fats are harmful, even in small amounts. To protect your health: Read labels carefully. Look beyond "0g trans fat" and scan for "partially hydrogenated oils." Choose whole foods and heart-healthy fats.

## 2024-01-29 NOTE — Assessment & Plan Note (Signed)
 No liver pain reported. She is motivated to continue weight loss and dietary changes. Although initially considered non-critical, an MRI is pursued due to concern about potential liver cancer. Order MRI of the liver with sedation to assess for potential liver cancer.

## 2024-01-29 NOTE — Progress Notes (Signed)
 ==============================  [facility]: [phone]   -- Medical Office Visit --  Patient: Patricia Gross      Age: 58 y.o.       Sex:  female  Date:   01/29/2024 Today's Healthcare Provider: Bernardino KANDICE Cone, MD  ==============================   Chief Complaint: 3 week f/u (Pt states she feel much better. ) At last visit she had multiiple issues to follow up but they have mostly resolved with improved diet.   Discussed the use of AI scribe software for clinical note transcription with the patient, who gave verbal consent to proceed.  History of Present Illness 58 year old female with hypertension who presents for management of chronic medical conditions.  She has experienced a significant improvement in her overall health, noting a weight loss of ten pounds due to dietary changes. She has eliminated sugar, flour, cakes, pasta, rice, and beans from her diet and is focused on maintaining muscle mass. She wants to start incorporating strength training exercises into her routine. She is not currently taking tirzepatide  due to insurance denial but is managing her weight through a high-protein, low-sugar diet consisting of chicken, [place], eggs, tuna, lean meat, pork loin, tofu, and yogurt. She drinks water regularly and avoids junk food and trans fats.  She is concerned about her blood pressure, which has been elevated with a recent reading of 159/91 mmHg. She experiences headaches, particularly at 3 AM, which she associates with her high blood pressure. She is currently taking bisoprolol  but reports that her blood pressure remains elevated. She has about five pills left and is open to medication adjustments.  She discusses her liver health, noting that she previously experienced pain in the liver area, which has since resolved. She is concerned about the possibility of liver cancer and is interested in further evaluation. She recalls a past MRI with  sedation for liver assessment done 17 years ago in [place] .  She reports a family history of brain aneurysms, with her mother, sister, and cousin having had them. She previously underwent an MRI with contrast 17 years ago, which showed no aneurysms at that time. She expresses concern about the potential for aneurysms given her family history.  She mentions experiencing subcutaneous nodules under her skin, which have remained unchanged. Her previous symptoms of finger swelling have improved since changing her diet. No current eye pain or drooping of the eyes.   Background Reviewed: Problem List: has Fatty liver; Benign essential hypertension; Lumbar pain; Acute lumbar myofascial strain; Musculoskeletal pain; DOE (dyspnea on exertion); Morbid obesity due to excess calories (HCC) complicated by HBP/LVH; Generalized lymphadenopathy; Abdominal swelling; Paresthesia of upper and lower extremities of both sides; Flexural eczema; Arthritis of both hands; Heel pain, bilateral; Subcutaneous nodule; Hashimoto thyroiditis; Dyslipidemia; Diverticula of colon; Prediabetes; Eosinophilia; Arthralgia of multiple joints; Arthritis; Daytime somnolence; Elevated liver enzymes; Heel pain; Hyperglycemia; Knee pain; Mild intermittent asthma without complication; Obesity with body mass index 30 or greater; Reactive airway disease; Seasonal allergies; Varicose veins of lower extremity; Splenomegaly; and Hepatic cirrhosis (HCC) on their problem list. Past Medical History:  has a past medical history of Arthritis, Asthma, Blood in urine (07/29/2017), Chronic pain, Hematuria (07/29/2017), Hypertension, Insulin resistance, Left lower quadrant pain (07/29/2017), Sleep apnea, Steatosis of liver (12/09/2023), and Vitamin D deficiency. Past Surgical History:   has a past surgical history that includes Gallbladder surgery and Cholecystectomy. Social History:   reports that she has never smoked. She has never used smokeless tobacco. She  reports that she does not drink alcohol and does not use drugs. Family History:  family history includes Alcohol abuse in her father; Aneurysm in her mother. Allergies:  is allergic to lisinopril.   Medication Reconciliation: Current Outpatient Medications on File Prior to Visit  Medication Sig   bisoprolol  (ZEBETA ) 5 MG tablet Take 1 tablet (5 mg total) by mouth daily.   mometasone  (ELOCON ) 0.1 % cream Apply as needed to skin rash/eczema   rosuvastatin  (CRESTOR ) 20 MG tablet Take 1 tablet (20 mg total) by mouth daily.   No current facility-administered medications on file prior to visit.   Medications Discontinued During This Encounter  Medication Reason   tirzepatide  (ZEPBOUND ) 2.5 MG/0.5ML Pen      Physical Exam:    01/29/2024    9:59 AM 12/18/2023    3:37 PM 12/06/2023    8:59 AM  Vitals with BMI  Height 5' 4 5' 4 5' 4  Weight 222 lbs 13 oz 233 lbs 237 lbs 6 oz  BMI 38.22 39.97 40.73  Systolic 148 126 877  Diastolic 96 76 78  Pulse 66 87 72  Vital signs reviewed.  Nursing notes reviewed. Weight trend reviewed. Physical Exam General Appearance:  No acute distress appreciable.   Well-groomed, healthy-appearing female.  Well proportioned with no abnormal fat distribution.  Good muscle tone. Pulmonary:  Normal work of breathing at rest, no respiratory distress apparent. SpO2: 96 %  Musculoskeletal: All extremities are intact.  Neurological:  Awake, alert, oriented, and engaged.  No obvious focal neurological deficits or cognitive impairments.  Sensorium seems unclouded.   Speech is clear and coherent with logical content. Psychiatric:  Appropriate mood, pleasant and cooperative demeanor, thoughtful and engaged during the exam  Results:    12/06/2023    9:12 AM 08/02/2017    5:27 PM 07/29/2017    9:59 AM 06/16/2017    9:34 AM  PHQ 2/9 Scores  PHQ - 2 Score 0 0 0 0  PHQ- 9 Score 0      Results RADIOLOGY MRI: Evidence of severe hepatic damage    No results found for  any visits on 01/29/24. Office Visit on 12/06/2023  Component Date Value Ref Range Status   Cholesterol 12/06/2023 188  0 - 200 mg/dL Final   Triglycerides 94/92/7974 251.0 (H)  0.0 - 149.0 mg/dL Final   HDL 94/92/7974 36.30 (L)  >60.99 mg/dL Final   VLDL 94/92/7974 50.2 (H)  0.0 - 40.0 mg/dL Final   LDL Cholesterol 12/06/2023 101 (H)  0 - 99 mg/dL Final   Total CHOL/HDL Ratio 12/06/2023 5   Final   NonHDL 12/06/2023 151.64   Final   Sodium 12/06/2023 139  135 - 145 mEq/L Final   Potassium 12/06/2023 4.3  3.5 - 5.1 mEq/L Final   Chloride 12/06/2023 104  96 - 112 mEq/L Final   CO2 12/06/2023 26  19 - 32 mEq/L Final   Glucose, Bld 12/06/2023 108 (H)  70 - 99 mg/dL Final   BUN 94/92/7974 13  6 - 23 mg/dL Final   Creatinine, Ser 12/06/2023 0.61  0.40 - 1.20 mg/dL Final   Total Bilirubin 12/06/2023 0.6  0.2 - 1.2 mg/dL Final   Alkaline Phosphatase 12/06/2023 68  39 - 117 U/L Final   AST 12/06/2023 30  0 - 37 U/L Final   ALT 12/06/2023 37 (H)  0 - 35 U/L Final   Total Protein 12/06/2023 7.2  6.0 - 8.3 g/dL Final   Albumin 94/92/7974 4.6  3.5 - 5.2 g/dL Final   GFR 94/92/7974 97.35  >60.00 mL/min Final   Calcium  12/06/2023 9.8  8.4 - 10.5 mg/dL Final   TSH 94/92/7974 3.670  0.450 - 4.500 uIU/mL Final   Hgb A1c MFr Bld 12/06/2023 5.8  4.6 - 6.5 % Final   Sed Rate 12/06/2023 19  0 - 30 mm/hr Final   CRP 12/06/2023 <1.0  0.5 - 20.0 mg/dL Final   Anti Nuclear Antibody (ANA) 12/06/2023 Negative  Negative Final   AST 12/06/2023 34  0 - 40 IU/L Final   ALT 12/06/2023 41 (H)  0 - 32 IU/L Final   Platelets 12/06/2023 215  150 - 450 x10E3/uL Final   FIB-4 Index 12/06/2023 1.48  0.00 - 2.67 Final   GGT 12/06/2023 49  7 - 51 U/L Final   Hep A IgM 12/06/2023 NON-REACTIVE  NON-REACTIVE Final   Hepatitis B Surface Ag 12/06/2023 NON-REACTIVE  NON-REACTIVE Final   Hep B C IgM 12/06/2023 NON-REACTIVE  NON-REACTIVE Final   HEPATITIS C ANTIBODY REFILL$(REFL) 12/06/2023 NON-REACTIVE  NON-REACTIVE Final    A-1 Antitrypsin, Ser 12/06/2023 122  83 - 199 mg/dL Final   Ferritin 94/92/7974 152.5  10.0 - 291.0 ng/mL Final   LKM1 Ab 12/06/2023 <=20.0  <=20.0 U Final   Smooth Muscle Ab 12/06/2023 12  0 - 19 Units Final   Mitochondrial Ab 12/06/2023 <20.0  0.0 - 20.0 Units Final   Amylase 12/06/2023 35  27 - 131 U/L Final   Lipase 12/06/2023 35.0  11.0 - 59.0 U/L Final   Color, Urine 12/06/2023 YELLOW  YELLOW Final   APPearance 12/06/2023 CLEAR  CLEAR Final   Specific Gravity, Urine 12/06/2023 1.013  1.001 - 1.035 Final   pH 12/06/2023 5.5  5.0 - 8.0 Final   Glucose, UA 12/06/2023 NEGATIVE  NEGATIVE Final   Bilirubin Urine 12/06/2023 NEGATIVE  NEGATIVE Final   Ketones, ur 12/06/2023 NEGATIVE  NEGATIVE Final   Hgb urine dipstick 12/06/2023 NEGATIVE  NEGATIVE Final   Protein, ur 12/06/2023 NEGATIVE  NEGATIVE Final   Nitrite 12/06/2023 NEGATIVE  NEGATIVE Final   Leukocytes,Ua 12/06/2023 NEGATIVE  NEGATIVE Final   WBC, UA 12/06/2023 NONE SEEN  0 - 5 /HPF Final   RBC / HPF 12/06/2023 NONE SEEN  0 - 2 /HPF Final   Squamous Epithelial / HPF 12/06/2023 0-5  < OR = 5 /HPF Final   Bacteria, UA 12/06/2023 NONE SEEN  NONE SEEN /HPF Final   Hyaline Cast 12/06/2023 NONE SEEN  NONE SEEN /LPF Final   Note 12/06/2023    Final   WBC 12/06/2023 5.8  4.0 - 10.5 K/uL Final   RBC 12/06/2023 5.12 (H)  3.87 - 5.11 Mil/uL Final   Hemoglobin 12/06/2023 14.5  12.0 - 15.0 g/dL Final   HCT 94/92/7974 44.3  36.0 - 46.0 % Final   MCV 12/06/2023 86.5  78.0 - 100.0 fl Final   MCHC 12/06/2023 32.8  30.0 - 36.0 g/dL Final   RDW 94/92/7974 14.2  11.5 - 15.5 % Final   Platelets 12/06/2023 198.0  150.0 - 400.0 K/uL Final   Neutrophils Relative % 12/06/2023 51.1  43.0 - 77.0 % Final   Lymphocytes Relative 12/06/2023 34.8  12.0 - 46.0 % Final   Monocytes Relative 12/06/2023 5.9  3.0 - 12.0 % Final   Eosinophils Relative 12/06/2023 7.5 (H)  0.0 - 5.0 % Final   Basophils Relative 12/06/2023 0.7  0.0 - 3.0 % Final   Neutro Abs  12/06/2023 2.9  1.4 -  7.7 K/uL Final   Lymphs Abs 12/06/2023 2.0  0.7 - 4.0 K/uL Final   Monocytes Absolute 12/06/2023 0.3  0.1 - 1.0 K/uL Final   Eosinophils Absolute 12/06/2023 0.4  0.0 - 0.7 K/uL Final   Basophils Absolute 12/06/2023 0.0  0.0 - 0.1 K/uL Final   REFLEX TIQ 12/06/2023    Final   ELF(TM) Score 12/06/2023 CANCELED   Final-Edited  Admission on 10/04/2022, Discharged on 10/04/2022  Component Date Value Ref Range Status   Lipase 10/04/2022 43  11 - 51 U/L Final   Sodium 10/04/2022 140  135 - 145 mmol/L Final   Potassium 10/04/2022 3.9  3.5 - 5.1 mmol/L Final   Chloride 10/04/2022 107  98 - 111 mmol/L Final   CO2 10/04/2022 19 (L)  22 - 32 mmol/L Final   Glucose, Bld 10/04/2022 110 (H)  70 - 99 mg/dL Final   BUN 96/94/7975 12  6 - 20 mg/dL Final   Creatinine, Ser 10/04/2022 0.70  0.44 - 1.00 mg/dL Final   Calcium  10/04/2022 9.1  8.9 - 10.3 mg/dL Final   Total Protein 96/94/7975 7.0  6.5 - 8.1 g/dL Final   Albumin 96/94/7975 4.0  3.5 - 5.0 g/dL Final   AST 96/94/7975 31  15 - 41 U/L Final   ALT 10/04/2022 37  0 - 44 U/L Final   Alkaline Phosphatase 10/04/2022 56  38 - 126 U/L Final   Total Bilirubin 10/04/2022 0.6  0.3 - 1.2 mg/dL Final   GFR, Estimated 10/04/2022 >60  >60 mL/min Final   Anion gap 10/04/2022 14  5 - 15 Final   Color, Urine 10/04/2022 STRAW (A)  YELLOW Final   APPearance 10/04/2022 CLEAR  CLEAR Final   Specific Gravity, Urine 10/04/2022 1.010  1.005 - 1.030 Final   pH 10/04/2022 6.0  5.0 - 8.0 Final   Glucose, UA 10/04/2022 NEGATIVE  NEGATIVE mg/dL Final   Hgb urine dipstick 10/04/2022 NEGATIVE  NEGATIVE Final   Bilirubin Urine 10/04/2022 NEGATIVE  NEGATIVE Final   Ketones, ur 10/04/2022 NEGATIVE  NEGATIVE mg/dL Final   Protein, ur 96/94/7975 NEGATIVE  NEGATIVE mg/dL Final   Nitrite 96/94/7975 NEGATIVE  NEGATIVE Final   Leukocytes,Ua 10/04/2022 NEGATIVE  NEGATIVE Final   WBC 10/04/2022 6.1  4.0 - 10.5 K/uL Final   RBC 10/04/2022 4.90  3.87 - 5.11  MIL/uL Final   Hemoglobin 10/04/2022 13.7  12.0 - 15.0 g/dL Final   HCT 96/94/7975 42.6  36.0 - 46.0 % Final   MCV 10/04/2022 86.9  80.0 - 100.0 fL Final   MCH 10/04/2022 28.0  26.0 - 34.0 pg Final   MCHC 10/04/2022 32.2  30.0 - 36.0 g/dL Final   RDW 96/94/7975 13.3  11.5 - 15.5 % Final   Platelets 10/04/2022 213  150 - 400 K/uL Final   nRBC 10/04/2022 0.0  0.0 - 0.2 % Final   Neutrophils Relative % 10/04/2022 51  % Final   Neutro Abs 10/04/2022 3.1  1.7 - 7.7 K/uL Final   Lymphocytes Relative 10/04/2022 36  % Final   Lymphs Abs 10/04/2022 2.2  0.7 - 4.0 K/uL Final   Monocytes Relative 10/04/2022 6  % Final   Monocytes Absolute 10/04/2022 0.3  0.1 - 1.0 K/uL Final   Eosinophils Relative 10/04/2022 6  % Final   Eosinophils Absolute 10/04/2022 0.4  0.0 - 0.5 K/uL Final   Basophils Relative 10/04/2022 1  % Final   Basophils Absolute 10/04/2022 0.0  0.0 - 0.1 K/uL Final  Immature Granulocytes 10/04/2022 0  % Final   Abs Immature Granulocytes 10/04/2022 0.01  0.00 - 0.07 K/uL Final   Preg Test, Ur 10/04/2022 NEGATIVE  NEGATIVE Final  No image results found. US  SOFT TISSUE RT UPPER EXTREMITY LTD (NON-VASCULAR) Result Date: 12/16/2023 CLINICAL DATA:  Lump felt in the right index finger and dorsum of the hand. EXAM: ULTRASOUND RIGHT UPPER EXTREMITY LIMITED TECHNIQUE: Ultrasound examination of the upper extremity soft tissues was performed in the area of clinical concern. COMPARISON:  None Available. FINDINGS: There is no evidence for soft tissue mass, cyst or abnormal vascularity in the dorsal area of the fourth metacarpal or region of palpable abnormality in the right index finger. IMPRESSION: No sonographic abnormality in the region of palpable abnormality in the right index finger or dorsal area of the fourth metacarpal. Electronically Signed   By: Greig Pique M.D.   On: 12/16/2023 22:24   US  SOFT TISSUE LOWER EXTREMITY LIMITED RIGHT (NON-VASCULAR) Result Date: 12/16/2023 CLINICAL DATA:   Right shin nodules. EXAM: ULTRASOUND RIGHT LOWER EXTREMITY LIMITED TECHNIQUE: Ultrasound examination of the lower extremity soft tissues was performed in the area of clinical concern. COMPARISON:  None Available. FINDINGS: There is no evidence for soft tissue mass, cyst or abnormal vascularity in the soft tissues of the right shin/area of palpable abnormality. IMPRESSION: No sonographic abnormality in the soft tissues of the right shin/area of palpable abnormality. Electronically Signed   By: Greig Pique M.D.   On: 12/16/2023 22:23   US  Pelvis Limited Result Date: 12/16/2023 CLINICAL DATA:  Subcutaneous nodules in the gluteal regions. EXAM: US  PELVIS LIMITED TECHNIQUE: Grayscale sonographic imaging of the soft tissues of the bilateral gluteal regions. COMPARISON:  None Available. FINDINGS: No evidence for soft tissue mass, cyst or abnormal vascularity in the region of the bilateral buttocks (region of concern). IMPRESSION: No sonographic abnormality in the region of concern. Electronically Signed   By: Greig Pique M.D.   On: 12/16/2023 21:44   US  Abdomen Complete Result Date: 12/08/2023 CLINICAL DATA:  Fatty liver. EXAM: ABDOMEN ULTRASOUND COMPLETE COMPARISON:  Renal CT October 04, 2022 FINDINGS: Gallbladder: Surgically removed. Common bile duct: Diameter: 7 mm, dilated, postsurgical in nature. Liver: No focal lesion. Increased echotexture with nodular contour. Portal vein is patent on color Doppler imaging with normal direction of blood flow towards the liver. IVC: Not well seen per ultrasound technologist. Pancreas: Not well seen per ultrasound technologist. Spleen: Normal echotexture. The spleen measures 14.1 x 12.1 x 5.6 cm with volume of 498.4 cc. Right Kidney: Length: 13.1 cm. Echogenicity within normal limits. No mass or hydronephrosis visualized. Left Kidney: Length: 11.6 cm. Echogenicity within normal limits. No mass or hydronephrosis visualized. Abdominal aorta: No aneurysm visualized. Other  findings: None. IMPRESSION: 1. Cirrhotic liver. No focal liver lesion identified. 2. Splenomegaly. Electronically Signed   By: Craig Farr M.D.   On: 12/08/2023 11:04   DG Hand Complete Left Result Date: 12/06/2023 CLINICAL DATA:  Left hand pain for 1 year. EXAM: LEFT HAND - COMPLETE 3+ VIEW COMPARISON:  None Available. FINDINGS: Neutral ulnar variance. Mild thumb carpometacarpal joint space narrowing, subchondral sclerosis, and peripheral osteophytosis. Mild to moderate second through fifth DIP joint space narrowing and peripheral osteophytosis, greatest within the second digit. Mild second through fifth PIP joint space narrowing and peripheral osteophytosis. Mild distal radial styloid degenerative spurring. No acute fracture or dislocation. No cortical erosion or periostitis. 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 Electronically Signed  By: Tanda Lyons M.D.   On: 12/06/2023 15:07   DG Hand Complete Right Result Date: 12/06/2023 CLINICAL DATA:  Bilateral hand pain for 1 year.  No known injury. EXAM: RIGHT HAND - COMPLETE 3+ VIEW COMPARISON:  None Available. FINDINGS: 2 mm ulnar negative variance. Minimal thumb carpometacarpal joint space narrowing, subchondral sclerosis, and peripheral osteophytosis. Mild-to-moderate second through fifth DIP and fifth PIP joint space narrowing and peripheral osteophytosis. There is a 3 mm chronic mineralized density dorsal to the distal aspect of the proximal phalanx of the fifth finger, nonspecific. Mild radiocarpal joint space narrowing. No acute fracture or dislocation. No cortical erosion or periostitis. IMPRESSION: 1. Mild-to-moderate second through fifth DIP and fifth PIP osteoarthritis. 2. Mild radiocarpal and minimal thumb carpometacarpal osteoarthritis. Electronically Signed   By: Tanda Lyons M.D.   On: 12/06/2023 15:04   DG Chest 2 View Result Date: 12/06/2023 CLINICAL DATA:  Generalized  lymphadenopathy.  Shortness of breath. EXAM: CHEST - 2 VIEW COMPARISON:  Chest radiograph 05/13/2019 FINDINGS: Normal heart size and mediastinal contours. No abnormal mediastinal contours to suggest adenopathy by radiograph. Stable mild interstitial thickening. No focal airspace disease, significant pleural effusion or pneumothorax. Normal pulmonary vasculature. Thoracic spondylosis. IMPRESSION: 1. No acute findings. 2. Stable mild interstitial thickening. 3. No radiographic evidence of adenopathy. Electronically Signed   By: Andrea Gasman M.D.   On: 12/06/2023 13:07   DG Os Calcis Right Result Date: 12/06/2023 CLINICAL DATA:  Heel pain. EXAM: RIGHT OS CALCIS - 2+ VIEW COMPARISON:  None Available. FINDINGS: Moderate to large fragmented Achilles tendon enthesophyte. Moderate plantar calcaneal spur. There are multiple soft tissue calcifications in the plantar region in the region of the plantar fascia, largest 6 mm. No evidence of fracture, erosion or focal bone abnormality. Normal subtalar alignment. IMPRESSION: 1. Moderate to large fragmented Achilles tendon enthesophyte. 2. Moderate plantar calcaneal spur. Multiple soft tissue calcifications in the region of the plantar fascia. Electronically Signed   By: Andrea Gasman M.D.   On: 12/06/2023 13:06   DG Os Calcis Left Result Date: 12/06/2023 CLINICAL DATA:  Left heel pain. EXAM: LEFT OS CALCIS - 2+ VIEW COMPARISON:  None Available. FINDINGS: Moderate Achilles tendon enthesophyte. Moderate plantar calcaneal spur multiple soft tissue calcifications in the region of the plantar fascia, largest measuring 12 mm. No evidence of fracture, erosion or focal bone abnormality. Normal subtalar alignment. IMPRESSION: 1. Moderate plantar calcaneal spur with multiple soft tissue calcifications in the region of the plantar fascia. 2. Moderate Achilles tendon enthesophyte. Electronically Signed   By: Andrea Gasman M.D.   On: 12/06/2023 13:05         ASSESSMENT &  PLAN   Assessment & Plan Primary hypertension Blood pressure is elevated at 159/91 mmHg. Bisoprolol  is ineffective and may reduce energy and hinder weight loss. Olmesartan is preferred for its efficacy without these adverse effects. Discontinue bisoprolol  and prescribe olmesartan 10 mg daily, increasing to 20 mg after one week. Order blood work in one week to assess tolerance to olmesartan and suitability for CT contrast dye. Obesity with body mass index 30 or greater She has lost 10 pounds and is motivated to continue weight loss through diet and exercise. Tirzepatide  is not used due to insurance denial. She follows a low sugar, high protein diet and plans to incorporate muscle-building exercises. Emphasize protein intake to prevent muscle loss during weight reduction. Continue current diet and exercise regimen and provide a dietary list focusing on low sugar, high protein foods. Arthritis Resolvign with  diet imps FH: brain aneurysm There is a family history of brain aneurysms with multiple relatives affected. A previous MRI 17 years ago showed no aneurysms. Current concern arises due to family history and recent headaches. An MRI with sedation is preferred for accuracy and to address claustrophobia. Order MRI of the brain with sedation to assess for aneurysms. Cirrhosis of liver with ascites, unspecified hepatic cirrhosis type (HCC) No liver pain reported. She is motivated to continue weight loss and dietary changes. Although initially considered non-critical, an MRI is pursued due to concern about potential liver cancer. Order MRI of the liver with sedation to assess for potential liver cancer.   ORDER ASSOCIATIONS  #   DIAGNOSIS / CONDITION ICD-10 ENCOUNTER ORDER     ICD-10-CM   1. Primary hypertension  I10 olmesartan (BENICAR) 20 MG tablet    Basic Metabolic Panel (BMET)    2. Obesity with body mass index 30 or greater  E66.9     3. Arthritis  M19.90     4. FH: brain aneurysm  Z82.49  MR ANGIO HEAD WO CONTRAST    5. Cirrhosis of liver with ascites, unspecified hepatic cirrhosis type (HCC)  K74.60 MR LIVER W CONTRAST   R18.8      MR ANGIO HEAD WO CONTRAST       Comments: With contrast, to evaluate for aneurysm in patient with frequent headache(s), high blood pressure, family history multiple aneurysms in mother and sister and cousin Request venous sedation In other words versed which ordering MD approves but follow protocol    MR LIVER W CONTRAST       Comments: Splenomegaly evaluate for hepatocellular carcinoma and cirrhosis in advanced liver disease.Request venous sedation In other words versed which ordering MD approves but follow protocol           This document was synthesized by artificial intelligence ([employer]bridge) using HIPAA-compliant recording of the clinical interaction;   We discussed the use of AI scribe software for clinical note transcription with the patient, who gave verbal consent to proceed. additional Info: This encounter employed state-of-the-art, real-time, collaborative documentation. The patient actively reviewed and assisted in updating their electronic medical record on a shared screen, ensuring transparency and facilitating joint problem-solving for the problem list, overview, and plan. This approach promotes accurate, informed care. The treatment plan was discussed and reviewed in detail, including medication safety, potential side effects, and all patient questions. We confirmed understanding and comfort with the plan. Follow-up instructions were established, including contacting the office for any concerns, returning if symptoms worsen, persist, or new symptoms develop, and precautions for potential emergency department visits.

## 2024-01-30 ENCOUNTER — Ambulatory Visit: Admitting: Dermatology

## 2024-02-13 NOTE — Addendum Note (Signed)
 Addended by: Doral Digangi Y on: 02/13/2024 02:35 PM   Modules accepted: Orders

## 2024-02-27 ENCOUNTER — Telehealth: Payer: Self-pay | Admitting: Internal Medicine

## 2024-02-27 NOTE — Telephone Encounter (Signed)
 Patient dropped off document Medical Condition Cert form, to be filled out by provider. Patient requested to send it back via Fax within ASAP. Document is located in providers tray at front office.Please advise at Mobile [phone] (mobile)

## 2024-02-27 NOTE — Telephone Encounter (Signed)
 Filled out and gave back to front office in box up there.

## 2024-02-29 ENCOUNTER — Encounter: Payer: Self-pay | Admitting: Internal Medicine

## 2024-02-29 ENCOUNTER — Ambulatory Visit (INDEPENDENT_AMBULATORY_CARE_PROVIDER_SITE_OTHER): Admitting: Internal Medicine

## 2024-02-29 VITALS — BP 130/74 | HR 73 | Temp 98.3°F | Ht 64.0 in | Wt 218.6 lb

## 2024-02-29 DIAGNOSIS — K7460 Unspecified cirrhosis of liver: Secondary | ICD-10-CM | POA: Diagnosis not present

## 2024-02-29 DIAGNOSIS — I10 Essential (primary) hypertension: Secondary | ICD-10-CM | POA: Diagnosis not present

## 2024-02-29 DIAGNOSIS — G4733 Obstructive sleep apnea (adult) (pediatric): Secondary | ICD-10-CM

## 2024-02-29 DIAGNOSIS — R591 Generalized enlarged lymph nodes: Secondary | ICD-10-CM

## 2024-02-29 DIAGNOSIS — R161 Splenomegaly, not elsewhere classified: Secondary | ICD-10-CM | POA: Diagnosis not present

## 2024-02-29 LAB — CBC WITH DIFFERENTIAL/PLATELET
Basophils Absolute: 0 K/uL (ref 0.0–0.1)
Basophils Relative: 0.6 % (ref 0.0–3.0)
Eosinophils Absolute: 0.2 K/uL (ref 0.0–0.7)
Eosinophils Relative: 3.8 % (ref 0.0–5.0)
HCT: 42.3 % (ref 36.0–46.0)
Hemoglobin: 13.8 g/dL (ref 12.0–15.0)
Lymphocytes Relative: 35 % (ref 12.0–46.0)
Lymphs Abs: 2 K/uL (ref 0.7–4.0)
MCHC: 32.8 g/dL (ref 30.0–36.0)
MCV: 85.5 fl (ref 78.0–100.0)
Monocytes Absolute: 0.4 K/uL (ref 0.1–1.0)
Monocytes Relative: 6.7 % (ref 3.0–12.0)
Neutro Abs: 3.1 K/uL (ref 1.4–7.7)
Neutrophils Relative %: 53.9 % (ref 43.0–77.0)
Platelets: 178 K/uL (ref 150.0–400.0)
RBC: 4.95 Mil/uL (ref 3.87–5.11)
RDW: 14 % (ref 11.5–15.5)
WBC: 5.8 K/uL (ref 4.0–10.5)

## 2024-02-29 LAB — COMPREHENSIVE METABOLIC PANEL WITH GFR
ALT: 30 U/L (ref 0–35)
AST: 23 U/L (ref 0–37)
Albumin: 4.5 g/dL (ref 3.5–5.2)
Alkaline Phosphatase: 59 U/L (ref 39–117)
BUN: 15 mg/dL (ref 6–23)
CO2: 26 meq/L (ref 19–32)
Calcium: 9.5 mg/dL (ref 8.4–10.5)
Chloride: 106 meq/L (ref 96–112)
Creatinine, Ser: 0.7 mg/dL (ref 0.40–1.20)
GFR: 94.02 mL/min (ref >60.00)
Glucose, Bld: 106 mg/dL — ABNORMAL HIGH (ref 70–99)
Potassium: 4.2 meq/L (ref 3.5–5.1)
Sodium: 140 meq/L (ref 135–145)
Total Bilirubin: 0.4 mg/dL (ref 0.2–1.2)
Total Protein: 7.4 g/dL (ref 6.0–8.3)

## 2024-02-29 MED ORDER — OLMESARTAN MEDOXOMIL 20 MG PO TABS: 20.0000 mg | ORAL_TABLET | Freq: Every day | ORAL | 4 refills | Status: DC

## 2024-02-29 MED ORDER — SEMAGLUTIDE-WEIGHT MANAGEMENT 1 MG/0.5ML ~~LOC~~ SOAJ: 1.0000 mg | SUBCUTANEOUS | 0 refills | Status: DC

## 2024-02-29 MED ORDER — SEMAGLUTIDE-WEIGHT MANAGEMENT 0.25 MG/0.5ML ~~LOC~~ SOAJ: 0.2500 mg | SUBCUTANEOUS | 0 refills | Status: DC

## 2024-02-29 MED ORDER — SEMAGLUTIDE-WEIGHT MANAGEMENT 0.5 MG/0.5ML ~~LOC~~ SOAJ: 0.5000 mg | SUBCUTANEOUS | 0 refills | Status: DC

## 2024-02-29 NOTE — Assessment & Plan Note (Signed)
 She has lost 15 pounds in the last two months due to lifestyle changes and partner support. Interested in Wegovy  for further weight loss and liver health benefits. Prescribe Wegovy  and encourage continuation of current lifestyle changes for weight management.

## 2024-02-29 NOTE — Patient Instructions (Addendum)
 It was a pleasure seeing you today! Your health and satisfaction are our top priorities.  Bernardino Cone, MD  VISIT SUMMARY: Patricia Gross, a 58 year old female, visited for a follow-up on weight loss and liver imaging. She has lost 15 pounds in the past two months through lifestyle changes and is concerned about potential liver issues. She has a history of sleep apnea, hypertension, and liver scarring. She is currently taking Benicar  for blood pressure management and is interested in Wegovy  for further weight loss.  YOUR PLAN: -OBESITY WITH BMI 30 OR GREATER: Obesity means having an excessive amount of body fat. You have successfully lost 15 pounds through lifestyle changes. We will prescribe Wegovy  to help with further weight loss and encourage you to continue your current lifestyle changes.  -CIRRHOSIS OF LIVER WITH ASCITES: Cirrhosis is severe scarring of the liver often caused by long-term liver damage. We will order an MRI of your liver with sedation to assess the extent of the scarring. If the MRI is not covered by insurance, we will consider an ultrasound as an alternative.  -PRIMARY HYPERTENSION: Hypertension is high blood pressure. Your blood pressure is well-controlled with your current medication, Benicar , and recent weight loss. We will continue your current dose of Benicar  and order blood work to ensure the medication is well-tolerated.  -OBSTRUCTIVE SLEEP APNEA, UNTREATED: Obstructive sleep apnea is a condition where breathing stops and starts during sleep. Your weight loss may have improved your symptoms. We will order a new home sleep test to reassess your condition and discuss the potential need for CPAP therapy based on the results.  INSTRUCTIONS: Please schedule an MRI of your liver with sedation. If the MRI is not covered by insurance, we will consider an ultrasound. Continue taking Benicar  20 mg daily and monitor your blood pressure at home. We will order blood work to ensure your  medication is well-tolerated. Schedule a new home sleep test to reassess your sleep apnea. Follow up with us  after completing these tests.  Your Providers PCP: Cone Bernardino MATSU, MD,  [phone]) Referring Provider: Cone Bernardino MATSU, MD,  [phone])  NEXT STEPS: [x]  Early Intervention: Schedule sooner appointment, call our on-call services, or go to emergency room if there is any significant Increase in pain or discomfort New or worsening symptoms Sudden or severe changes in your health [x]  Flexible Follow-Up: We recommend a Return in about 1 month (around 03/31/2024). for optimal routine care. This allows for progress monitoring and treatment adjustments. [x]  Preventive Care: Schedule your annual preventive care visit! It's typically covered by insurance and helps identify potential health issues early. [x]  Lab & X-ray Appointments: Incomplete tests scheduled today, or call to schedule. X-rays: [facility] (M-F, 8:30am-noon or 1pm-5pm). [x]  Medical Information Release: Sign a release form at front desk to obtain relevant medical information we don't have.  MAKING THE MOST OF OUR FOCUSED 20 MINUTE APPOINTMENTS: [x]   Clearly state your top concerns at the beginning of the visit to focus our discussion [x]   If you anticipate you will need more time, please inform the front desk during scheduling - we can book multiple appointments in the same week. [x]   If you have transportation problems- use our convenient video appointments or ask about transportation support. [x]   We can get down to business faster if you use MyChart to update information before the visit and submit non-urgent questions before your visit. Thank you for taking the time to provide details through MyChart.  Let our nurse know  and she can import this information into your encounter documents.  Arrival and Wait Times: [x]   Arriving on time ensures that everyone receives prompt attention. [x]   Early morning  (8a) and afternoon (1p) appointments tend to have shortest wait times. [x]   Unfortunately, we cannot delay appointments for late arrivals or hold slots during phone calls.  Getting Answers and Following Up [x]   Simple Questions & Concerns: For quick questions or basic follow-up after your visit, reach us  at [phone] or MyChart messaging. [x]   Complex Concerns: If your concern is more complex, scheduling an appointment might be best. Discuss this with the staff to find the most suitable option. [x]   Lab & Imaging Results: We'll contact you directly if results are abnormal or you don't use MyChart. Most normal results will be on MyChart within 2-3 business days, with a review message from Dr. Jesus. Haven't heard back in 2 weeks? Need results sooner? Contact us  at [phone]. [x]   Referrals: Our referral coordinator will manage specialist referrals. The specialist's office should contact you within 2 weeks to schedule an appointment. Call us  if you haven't heard from them after 2 weeks.  Staying Connected [x]   MyChart: Activate your MyChart for the fastest way to access results and message us . See the last page of this paperwork for instructions on how to activate.  Bring to Your Next Appointment [x]   Medications: Please bring all your medication bottles to your next appointment to ensure we have an accurate record of your prescriptions. [x]   Health Diaries: If you're monitoring any health conditions at home, keeping a diary of your readings can be very helpful for discussions at your next appointment.  Billing [x]   X-ray & Lab Orders: These are billed by separate companies. Contact the invoicing company directly for questions or concerns. [x]   Visit Charges: Discuss any billing inquiries with our administrative services team.  Your Satisfaction Matters [x]   Share Your Experience: We strive for your satisfaction! If you have any complaints, or preferably compliments, please let Dr.  Jesus know directly or contact our Practice Administrators, Manuelita Rubin or [employer], by asking at the front desk.   Reviewing Your Records [x]   Review this early draft of your clinical encounter notes below and the final encounter summary tomorrow on MyChart after its been completed.  All orders placed so far are visible here: Cirrhosis of liver without ascites, unspecified hepatic cirrhosis type (HCC) -     MR LIVER W CONTRAST; Future  Primary hypertension -     Olmesartan  Medoxomil; Take 1 tablet (20 mg total) by mouth daily. Start at just half tablet daily for first week.  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  Dispense: 2 mL; Refill: 0 -     Semaglutide -Weight Management; Inject 0.5 mg into the skin once a week for 28 days.  Dispense: 2 mL; Refill: 0 -     Semaglutide -Weight Management; Inject 1 mg into the skin once a week for 28 days.  Dispense: 2 mL; Refill: 0

## 2024-02-29 NOTE — Progress Notes (Signed)
 ==============================  [facility]: [phone]   -- Medical Office Visit --  Patient: Patricia Gross      Age: 58 y.o.       Sex:  female  Date:   02/29/2024 Today's Healthcare Provider: Bernardino KANDICE Cone, MD  ==============================   Chief Complaint: 1 month f/u   Discussed the use of AI scribe software for clinical note transcription with the patient, who gave verbal consent to proceed.  History of Present Illness Patricia Gross is a 58 year old female with liver scarring who presents for follow-up on weight loss and liver imaging.  She has lost 15 pounds over the past two months through lifestyle changes, currently weighing 218 pounds. Her dietary modifications include avoiding sugar, flour, bread, rice, and pasta. She is not using weight loss medications such as Tirzepatide  or Wegovy .  She is concerned about potential liver issues and recalls being told about possible scarring in her liver in the past. She recalls a previous recommendation for an MRI of the liver. She experiences anxiety about undergoing MRI procedures and prefers sedation. She has not had any recent imaging studies.  She reports a history of sleep apnea and has had previous sleep studies, but is not currently using a CPAP machine. She notes an improvement in her symptoms, including reduced daytime sleepiness and snoring, which she attributes to her recent weight loss. She has not had a sleep study in the past two years.  She has a history of hypertension and reports monitoring her blood pressure at home. She monitors her blood pressure at home and notes a reduction in finger swelling. She is currently taking Benicar  20 mg daily for blood pressure management.  No alcohol consumption, no current issues with libido or menopausal symptoms, and no recent skin nodules. Family history of aneurysms.  Wt Readings from Last 10 Encounters:  02/29/24 218 lb 9.6 oz (99.2 kg)   01/29/24 222 lb 12.8 oz (101.1 kg)  12/18/23 233 lb (105.7 kg)  12/06/23 237 lb 6.4 oz (107.7 kg)  10/04/22 230 lb (104.3 kg)  01/01/20 230 lb 6.4 oz (104.5 kg)  05/31/19 228 lb 12.8 oz (103.8 kg)  05/23/19 228 lb (103.4 kg)  05/11/19 230 lb (104.3 kg)  08/02/17 225 lb 12.8 oz (102.4 kg)    Background Reviewed: Problem List: has Fatty liver; Benign essential hypertension; Lumbar pain; Musculoskeletal pain; DOE (dyspnea on exertion); Morbid obesity due to excess calories (HCC) complicated by HBP/LVH; Generalized lymphadenopathy; Abdominal swelling; Paresthesia of upper and lower extremities of both sides; Flexural eczema; Heel pain, bilateral; Subcutaneous nodule; Hashimoto thyroiditis; Dyslipidemia; Diverticula of colon; Prediabetes; Eosinophilia; Arthritis; OSA (obstructive sleep apnea); Elevated liver enzymes; Heel pain; Hyperglycemia; Mild intermittent asthma without complication; Obesity with body mass index 30 or greater; Reactive airway disease; Seasonal allergies; Varicose veins of lower extremity; Splenomegaly; and Hepatic cirrhosis (HCC) on their problem list. Past Medical History:  has a past medical history of Acute lumbar myofascial strain (08/02/2017), Arthralgia of multiple joints (01/25/2016), Arthritis, Asthma, Blood in urine (07/29/2017), Chronic pain, Hematuria (07/29/2017), Hypertension, Insulin resistance, Left lower quadrant pain (07/29/2017), Sleep apnea, Steatosis of liver (12/09/2023), and Vitamin D deficiency. Past Surgical History:   has a past surgical history that includes Gallbladder surgery and Cholecystectomy. Social History:   reports that she has never smoked. She has never used smokeless tobacco. She reports that she does not drink alcohol and does not use drugs. Family History:  family history includes Alcohol abuse in  her father; Aneurysm in her mother. Allergies:  is allergic to lisinopril.   Medication Reconciliation: Current Outpatient Medications on File  Prior to Visit  Medication Sig   bisoprolol  (ZEBETA ) 5 MG tablet Take 1 tablet (5 mg total) by mouth daily.   mometasone  (ELOCON ) 0.1 % cream Apply as needed to skin rash/eczema   rosuvastatin  (CRESTOR ) 20 MG tablet Take 1 tablet (20 mg total) by mouth daily.   No current facility-administered medications on file prior to visit.   Medications Discontinued During This Encounter  Medication Reason   olmesartan  (BENICAR ) 20 MG tablet Reorder     Physical Exam:    02/29/2024    8:53 AM 01/29/2024    9:59 AM 12/18/2023    3:37 PM  Vitals with BMI  Height 5' 4 5' 4 5' 4  Weight 218 lbs 10 oz 222 lbs 13 oz 233 lbs  BMI 37.5 38.22 39.97  Systolic 130 148 873  Diastolic 74 96 76  Pulse 73 66 87  Vital signs reviewed.  Nursing notes reviewed. Weight trend reviewed. Physical Exam General Appearance:  No acute distress appreciable.   Well-groomed, healthy-appearing female.  Well proportioned with no abnormal fat distribution.  Good muscle tone. Pulmonary:  Normal work of breathing at rest, no respiratory distress apparent. SpO2: 96 %  Musculoskeletal: All extremities are intact.  Neurological:  Awake, alert, oriented, and engaged.  No obvious focal neurological deficits or cognitive impairments.  Sensorium seems unclouded.   Speech is clear and coherent with logical content. Psychiatric:  Appropriate mood, pleasant and cooperative demeanor, thoughtful and engaged during the exam   Results:    12/06/2023    9:12 AM 08/02/2017    5:27 PM 07/29/2017    9:59 AM 06/16/2017    9:34 AM  PHQ 2/9 Scores  PHQ - 2 Score 0 0 0 0  PHQ- 9 Score 0      Results     No results found for any visits on 02/29/24. Office Visit on 12/06/2023  Component Date Value Ref Range Status   Cholesterol 12/06/2023 188  0 - 200 mg/dL Final   Triglycerides 94/92/7974 251.0 (H)  0.0 - 149.0 mg/dL Final   HDL 94/92/7974 36.30 (L)  >60.99 mg/dL Final   VLDL 94/92/7974 50.2 (H)  0.0 - 40.0 mg/dL Final   LDL  Cholesterol 12/06/2023 101 (H)  0 - 99 mg/dL Final   Total CHOL/HDL Ratio 12/06/2023 5   Final   NonHDL 12/06/2023 151.64   Final   Sodium 12/06/2023 139  135 - 145 mEq/L Final   Potassium 12/06/2023 4.3  3.5 - 5.1 mEq/L Final   Chloride 12/06/2023 104  96 - 112 mEq/L Final   CO2 12/06/2023 26  19 - 32 mEq/L Final   Glucose, Bld 12/06/2023 108 (H)  70 - 99 mg/dL Final   BUN 94/92/7974 13  6 - 23 mg/dL Final   Creatinine, Ser 12/06/2023 0.61  0.40 - 1.20 mg/dL Final   Total Bilirubin 12/06/2023 0.6  0.2 - 1.2 mg/dL Final   Alkaline Phosphatase 12/06/2023 68  39 - 117 U/L Final   AST 12/06/2023 30  0 - 37 U/L Final   ALT 12/06/2023 37 (H)  0 - 35 U/L Final   Total Protein 12/06/2023 7.2  6.0 - 8.3 g/dL Final   Albumin 94/92/7974 4.6  3.5 - 5.2 g/dL Final   GFR 94/92/7974 97.35  >60.00 mL/min Final   Calcium  12/06/2023 9.8  8.4 - 10.5 mg/dL Final  TSH 12/06/2023 3.670  0.450 - 4.500 uIU/mL Final   Hgb A1c MFr Bld 12/06/2023 5.8  4.6 - 6.5 % Final   Sed Rate 12/06/2023 19  0 - 30 mm/hr Final   CRP 12/06/2023 <1.0  0.5 - 20.0 mg/dL Final   Anti Nuclear Antibody (ANA) 12/06/2023 Negative  Negative Final   AST 12/06/2023 34  0 - 40 IU/L Final   ALT 12/06/2023 41 (H)  0 - 32 IU/L Final   Platelets 12/06/2023 215  150 - 450 x10E3/uL Final   FIB-4 Index 12/06/2023 1.48  0.00 - 2.67 Final   GGT 12/06/2023 49  7 - 51 U/L Final   Hep A IgM 12/06/2023 NON-REACTIVE  NON-REACTIVE Final   Hepatitis B Surface Ag 12/06/2023 NON-REACTIVE  NON-REACTIVE Final   Hep B C IgM 12/06/2023 NON-REACTIVE  NON-REACTIVE Final   HEPATITIS C ANTIBODY REFILL$(REFL) 12/06/2023 NON-REACTIVE  NON-REACTIVE Final   A-1 Antitrypsin, Ser 12/06/2023 122  83 - 199 mg/dL Final   Ferritin 94/92/7974 152.5  10.0 - 291.0 ng/mL Final   LKM1 Ab 12/06/2023 <=20.0  <=20.0 U Final   Smooth Muscle Ab 12/06/2023 12  0 - 19 Units Final   Mitochondrial Ab 12/06/2023 <20.0  0.0 - 20.0 Units Final   Amylase 12/06/2023 35  27 - 131 U/L  Final   Lipase 12/06/2023 35.0  11.0 - 59.0 U/L Final   Color, Urine 12/06/2023 YELLOW  YELLOW Final   APPearance 12/06/2023 CLEAR  CLEAR Final   Specific Gravity, Urine 12/06/2023 1.013  1.001 - 1.035 Final   pH 12/06/2023 5.5  5.0 - 8.0 Final   Glucose, UA 12/06/2023 NEGATIVE  NEGATIVE Final   Bilirubin Urine 12/06/2023 NEGATIVE  NEGATIVE Final   Ketones, ur 12/06/2023 NEGATIVE  NEGATIVE Final   Hgb urine dipstick 12/06/2023 NEGATIVE  NEGATIVE Final   Protein, ur 12/06/2023 NEGATIVE  NEGATIVE Final   Nitrite 12/06/2023 NEGATIVE  NEGATIVE Final   Leukocytes,Ua 12/06/2023 NEGATIVE  NEGATIVE Final   WBC, UA 12/06/2023 NONE SEEN  0 - 5 /HPF Final   RBC / HPF 12/06/2023 NONE SEEN  0 - 2 /HPF Final   Squamous Epithelial / HPF 12/06/2023 0-5  < OR = 5 /HPF Final   Bacteria, UA 12/06/2023 NONE SEEN  NONE SEEN /HPF Final   Hyaline Cast 12/06/2023 NONE SEEN  NONE SEEN /LPF Final   Note 12/06/2023    Final   WBC 12/06/2023 5.8  4.0 - 10.5 K/uL Final   RBC 12/06/2023 5.12 (H)  3.87 - 5.11 Mil/uL Final   Hemoglobin 12/06/2023 14.5  12.0 - 15.0 g/dL Final   HCT 94/92/7974 44.3  36.0 - 46.0 % Final   MCV 12/06/2023 86.5  78.0 - 100.0 fl Final   MCHC 12/06/2023 32.8  30.0 - 36.0 g/dL Final   RDW 94/92/7974 14.2  11.5 - 15.5 % Final   Platelets 12/06/2023 198.0  150.0 - 400.0 K/uL Final   Neutrophils Relative % 12/06/2023 51.1  43.0 - 77.0 % Final   Lymphocytes Relative 12/06/2023 34.8  12.0 - 46.0 % Final   Monocytes Relative 12/06/2023 5.9  3.0 - 12.0 % Final   Eosinophils Relative 12/06/2023 7.5 (H)  0.0 - 5.0 % Final   Basophils Relative 12/06/2023 0.7  0.0 - 3.0 % Final   Neutro Abs 12/06/2023 2.9  1.4 - 7.7 K/uL Final   Lymphs Abs 12/06/2023 2.0  0.7 - 4.0 K/uL Final   Monocytes Absolute 12/06/2023 0.3  0.1 - 1.0 K/uL Final  Eosinophils Absolute 12/06/2023 0.4  0.0 - 0.7 K/uL Final   Basophils Absolute 12/06/2023 0.0  0.0 - 0.1 K/uL Final   REFLEX TIQ 12/06/2023    Final   ELF(TM) Score  12/06/2023 CANCELED   Final-Edited  Admission on 10/04/2022, Discharged on 10/04/2022  Component Date Value Ref Range Status   Lipase 10/04/2022 43  11 - 51 U/L Final   Sodium 10/04/2022 140  135 - 145 mmol/L Final   Potassium 10/04/2022 3.9  3.5 - 5.1 mmol/L Final   Chloride 10/04/2022 107  98 - 111 mmol/L Final   CO2 10/04/2022 19 (L)  22 - 32 mmol/L Final   Glucose, Bld 10/04/2022 110 (H)  70 - 99 mg/dL Final   BUN 96/94/7975 12  6 - 20 mg/dL Final   Creatinine, Ser 10/04/2022 0.70  0.44 - 1.00 mg/dL Final   Calcium  10/04/2022 9.1  8.9 - 10.3 mg/dL Final   Total Protein 96/94/7975 7.0  6.5 - 8.1 g/dL Final   Albumin 96/94/7975 4.0  3.5 - 5.0 g/dL Final   AST 96/94/7975 31  15 - 41 U/L Final   ALT 10/04/2022 37  0 - 44 U/L Final   Alkaline Phosphatase 10/04/2022 56  38 - 126 U/L Final   Total Bilirubin 10/04/2022 0.6  0.3 - 1.2 mg/dL Final   GFR, Estimated 10/04/2022 >60  >60 mL/min Final   Anion gap 10/04/2022 14  5 - 15 Final   Color, Urine 10/04/2022 STRAW (A)  YELLOW Final   APPearance 10/04/2022 CLEAR  CLEAR Final   Specific Gravity, Urine 10/04/2022 1.010  1.005 - 1.030 Final   pH 10/04/2022 6.0  5.0 - 8.0 Final   Glucose, UA 10/04/2022 NEGATIVE  NEGATIVE mg/dL Final   Hgb urine dipstick 10/04/2022 NEGATIVE  NEGATIVE Final   Bilirubin Urine 10/04/2022 NEGATIVE  NEGATIVE Final   Ketones, ur 10/04/2022 NEGATIVE  NEGATIVE mg/dL Final   Protein, ur 96/94/7975 NEGATIVE  NEGATIVE mg/dL Final   Nitrite 96/94/7975 NEGATIVE  NEGATIVE Final   Leukocytes,Ua 10/04/2022 NEGATIVE  NEGATIVE Final   WBC 10/04/2022 6.1  4.0 - 10.5 K/uL Final   RBC 10/04/2022 4.90  3.87 - 5.11 MIL/uL Final   Hemoglobin 10/04/2022 13.7  12.0 - 15.0 g/dL Final   HCT 96/94/7975 42.6  36.0 - 46.0 % Final   MCV 10/04/2022 86.9  80.0 - 100.0 fL Final   MCH 10/04/2022 28.0  26.0 - 34.0 pg Final   MCHC 10/04/2022 32.2  30.0 - 36.0 g/dL Final   RDW 96/94/7975 13.3  11.5 - 15.5 % Final   Platelets 10/04/2022 213   150 - 400 K/uL Final   nRBC 10/04/2022 0.0  0.0 - 0.2 % Final   Neutrophils Relative % 10/04/2022 51  % Final   Neutro Abs 10/04/2022 3.1  1.7 - 7.7 K/uL Final   Lymphocytes Relative 10/04/2022 36  % Final   Lymphs Abs 10/04/2022 2.2  0.7 - 4.0 K/uL Final   Monocytes Relative 10/04/2022 6  % Final   Monocytes Absolute 10/04/2022 0.3  0.1 - 1.0 K/uL Final   Eosinophils Relative 10/04/2022 6  % Final   Eosinophils Absolute 10/04/2022 0.4  0.0 - 0.5 K/uL Final   Basophils Relative 10/04/2022 1  % Final   Basophils Absolute 10/04/2022 0.0  0.0 - 0.1 K/uL Final   Immature Granulocytes 10/04/2022 0  % Final   Abs Immature Granulocytes 10/04/2022 0.01  0.00 - 0.07 K/uL Final   Preg Test, Ur 10/04/2022 NEGATIVE  NEGATIVE Final  No image results found. US  SOFT TISSUE RT UPPER EXTREMITY LTD (NON-VASCULAR) Result Date: 12/16/2023 CLINICAL DATA:  Lump felt in the right index finger and dorsum of the hand. EXAM: ULTRASOUND RIGHT UPPER EXTREMITY LIMITED TECHNIQUE: Ultrasound examination of the upper extremity soft tissues was performed in the area of clinical concern. COMPARISON:  None Available. FINDINGS: There is no evidence for soft tissue mass, cyst or abnormal vascularity in the dorsal area of the fourth metacarpal or region of palpable abnormality in the right index finger. IMPRESSION: No sonographic abnormality in the region of palpable abnormality in the right index finger or dorsal area of the fourth metacarpal. Electronically Signed   By: Greig Pique M.D.   On: 12/16/2023 22:24   US  SOFT TISSUE LOWER EXTREMITY LIMITED RIGHT (NON-VASCULAR) Result Date: 12/16/2023 CLINICAL DATA:  Right shin nodules. EXAM: ULTRASOUND RIGHT LOWER EXTREMITY LIMITED TECHNIQUE: Ultrasound examination of the lower extremity soft tissues was performed in the area of clinical concern. COMPARISON:  None Available. FINDINGS: There is no evidence for soft tissue mass, cyst or abnormal vascularity in the soft tissues of the  right shin/area of palpable abnormality. IMPRESSION: No sonographic abnormality in the soft tissues of the right shin/area of palpable abnormality. Electronically Signed   By: Greig Pique M.D.   On: 12/16/2023 22:23   US  Pelvis Limited Result Date: 12/16/2023 CLINICAL DATA:  Subcutaneous nodules in the gluteal regions. EXAM: US  PELVIS LIMITED TECHNIQUE: Grayscale sonographic imaging of the soft tissues of the bilateral gluteal regions. COMPARISON:  None Available. FINDINGS: No evidence for soft tissue mass, cyst or abnormal vascularity in the region of the bilateral buttocks (region of concern). IMPRESSION: No sonographic abnormality in the region of concern. Electronically Signed   By: Greig Pique M.D.   On: 12/16/2023 21:44   US  Abdomen Complete Result Date: 12/08/2023 CLINICAL DATA:  Fatty liver. EXAM: ABDOMEN ULTRASOUND COMPLETE COMPARISON:  Renal CT October 04, 2022 FINDINGS: Gallbladder: Surgically removed. Common bile duct: Diameter: 7 mm, dilated, postsurgical in nature. Liver: No focal lesion. Increased echotexture with nodular contour. Portal vein is patent on color Doppler imaging with normal direction of blood flow towards the liver. IVC: Not well seen per ultrasound technologist. Pancreas: Not well seen per ultrasound technologist. Spleen: Normal echotexture. The spleen measures 14.1 x 12.1 x 5.6 cm with volume of 498.4 cc. Right Kidney: Length: 13.1 cm. Echogenicity within normal limits. No mass or hydronephrosis visualized. Left Kidney: Length: 11.6 cm. Echogenicity within normal limits. No mass or hydronephrosis visualized. Abdominal aorta: No aneurysm visualized. Other findings: None. IMPRESSION: 1. Cirrhotic liver. No focal liver lesion identified. 2. Splenomegaly. Electronically Signed   By: Craig Farr M.D.   On: 12/08/2023 11:04   DG Hand Complete Left Result Date: 12/06/2023 CLINICAL DATA:  Left hand pain for 1 year. EXAM: LEFT HAND - COMPLETE 3+ VIEW COMPARISON:  None Available.  FINDINGS: Neutral ulnar variance. Mild thumb carpometacarpal joint space narrowing, subchondral sclerosis, and peripheral osteophytosis. Mild to moderate second through fifth DIP joint space narrowing and peripheral osteophytosis, greatest within the second digit. Mild second through fifth PIP joint space narrowing and peripheral osteophytosis. Mild distal radial styloid degenerative spurring. No acute fracture or dislocation. No cortical erosion or periostitis. 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 Electronically Signed   By: Tanda Lyons M.D.   On: 12/06/2023 15:07   DG Hand Complete Right Result Date: 12/06/2023 CLINICAL DATA:  Bilateral hand pain for 1 year.  No known injury. EXAM: RIGHT HAND - COMPLETE 3+ VIEW COMPARISON:  None Available. FINDINGS: 2 mm ulnar negative variance. Minimal thumb carpometacarpal joint space narrowing, subchondral sclerosis, and peripheral osteophytosis. Mild-to-moderate second through fifth DIP and fifth PIP joint space narrowing and peripheral osteophytosis. There is a 3 mm chronic mineralized density dorsal to the distal aspect of the proximal phalanx of the fifth finger, nonspecific. Mild radiocarpal joint space narrowing. No acute fracture or dislocation. No cortical erosion or periostitis. IMPRESSION: 1. Mild-to-moderate second through fifth DIP and fifth PIP osteoarthritis. 2. Mild radiocarpal and minimal thumb carpometacarpal osteoarthritis. Electronically Signed   By: Tanda Lyons M.D.   On: 12/06/2023 15:04   DG Chest 2 View Result Date: 12/06/2023 CLINICAL DATA:  Generalized lymphadenopathy.  Shortness of breath. EXAM: CHEST - 2 VIEW COMPARISON:  Chest radiograph 05/13/2019 FINDINGS: Normal heart size and mediastinal contours. No abnormal mediastinal contours to suggest adenopathy by radiograph. Stable mild interstitial thickening. No focal airspace disease, significant pleural effusion or pneumothorax.  Normal pulmonary vasculature. Thoracic spondylosis. IMPRESSION: 1. No acute findings. 2. Stable mild interstitial thickening. 3. No radiographic evidence of adenopathy. Electronically Signed   By: Andrea Gasman M.D.   On: 12/06/2023 13:07   DG Os Calcis Right Result Date: 12/06/2023 CLINICAL DATA:  Heel pain. EXAM: RIGHT OS CALCIS - 2+ VIEW COMPARISON:  None Available. FINDINGS: Moderate to large fragmented Achilles tendon enthesophyte. Moderate plantar calcaneal spur. There are multiple soft tissue calcifications in the plantar region in the region of the plantar fascia, largest 6 mm. No evidence of fracture, erosion or focal bone abnormality. Normal subtalar alignment. IMPRESSION: 1. Moderate to large fragmented Achilles tendon enthesophyte. 2. Moderate plantar calcaneal spur. Multiple soft tissue calcifications in the region of the plantar fascia. Electronically Signed   By: Andrea Gasman M.D.   On: 12/06/2023 13:06   DG Os Calcis Left Result Date: 12/06/2023 CLINICAL DATA:  Left heel pain. EXAM: LEFT OS CALCIS - 2+ VIEW COMPARISON:  None Available. FINDINGS: Moderate Achilles tendon enthesophyte. Moderate plantar calcaneal spur multiple soft tissue calcifications in the region of the plantar fascia, largest measuring 12 mm. No evidence of fracture, erosion or focal bone abnormality. Normal subtalar alignment. IMPRESSION: 1. Moderate plantar calcaneal spur with multiple soft tissue calcifications in the region of the plantar fascia. 2. Moderate Achilles tendon enthesophyte. Electronically Signed   By: Andrea Gasman M.D.   On: 12/06/2023 13:05         ASSESSMENT & PLAN   Assessment & Plan Primary hypertension Blood pressure is well-controlled with current medication and weight loss. Reports reduced swelling in fingers. Continue Benicar  20 mg daily. Order blood work to ensure medication tolerance and refill prescription for Benicar .encouraged patient to do home blood pressure  monitoring. Cirrhosis of liver without ascites, unspecified hepatic cirrhosis type (HCC) Splenomegaly Cirrhosis with concern for liver scarring. An MRI of the liver is planned to assess scarring extent, but insurance may require evidence of a liver mass for coverage. Order MRI of the liver with sedation. If MRI is not covered, consider ultrasound as an alternative. OSA (obstructive sleep apnea) Not currently using CPAP despite previous sleep study indicating sleep apnea. Weight loss may have improved symptoms. Order a new home sleep test to reassess sleep apnea status and discuss potential for CPAP therapy based on new sleep study results. Generalized lymphadenopathy Although this resolved she request MRI rule out liver cancer due to prior scare.   Morbid obesity due to excess calories (HCC) complicated by  HBP/LVH She has lost 15 pounds in the last two months due to lifestyle changes and partner support. Interested in Wegovy  for further weight loss and liver health benefits. Prescribe Wegovy  and encourage continuation of current lifestyle changes for weight management.  ORDER ASSOCIATIONS  #   DIAGNOSIS / CONDITION ICD-10 ENCOUNTER ORDER     ICD-10-CM   1. Cirrhosis of liver without ascites, unspecified hepatic cirrhosis type (HCC)  K74.60 MR LIVER W CONTRAST    2. Primary hypertension  I10 olmesartan  (BENICAR ) 20 MG tablet    CBC with Differential/Platelet    Comprehensive metabolic panel with GFR    3. Splenomegaly  R16.1 MR LIVER W CONTRAST    4. Arthritis  M19.90     5. OSA (obstructive sleep apnea)  G47.33 Home sleep test    6. Generalized lymphadenopathy  R59.1     7. Morbid obesity due to excess calories (HCC) complicated by HBP/LVH  E66.01      Meds ordered this encounter  Medications   olmesartan  (BENICAR ) 20 MG tablet    Sig: Take 1 tablet (20 mg total) by mouth daily. Start at just half tablet daily for first week.    Dispense:  90 tablet    Refill:  4    Semaglutide -Weight Management 0.25 MG/0.5ML SOAJ    Sig: Inject 0.25 mg into the skin once a week for 28 days.    Dispense:  2 mL    Refill:  0   Semaglutide -Weight Management 0.5 MG/0.5ML SOAJ    Sig: Inject 0.5 mg into the skin once a week for 28 days.    Dispense:  2 mL    Refill:  0   Semaglutide -Weight Management 1 MG/0.5ML SOAJ    Sig: Inject 1 mg into the skin once a week for 28 days.    Dispense:  2 mL    Refill:  0   Orders Placed This Encounter  Procedures   MR LIVER W CONTRAST    Standing Status:   Future    Expiration Date:   02/28/2025    If indicated for the ordered procedure, I authorize the administration of contrast media per Radiology protocol:   Yes    What is the patient's sedation requirement?:   No Sedation    Does the patient have a pacemaker or implanted devices?:   No    Preferred imaging location?:   [facility] (table limit - 500lbs)   CBC with Differential/Platelet   Comprehensive metabolic panel with GFR    [place]    Has the patient fasted?:   No   Home sleep test    Where should this test be performed::   Other     This document was synthesized by artificial intelligence ([employer]) using HIPAA-compliant recording of the clinical interaction;   We discussed the use of AI scribe software for clinical note transcription with the patient, who gave verbal consent to proceed. additional Info: This encounter employed state-of-the-art, real-time, collaborative documentation. The patient actively reviewed and assisted in updating their electronic medical record on a shared screen, ensuring transparency and facilitating joint problem-solving for the problem list, overview, and plan. This approach promotes accurate, informed care. The treatment plan was discussed and reviewed in detail, including medication safety, potential side effects, and all patient questions. We confirmed understanding and comfort with the plan. Follow-up instructions were established,  including contacting the office for any concerns, returning if symptoms worsen, persist, or new symptoms develop, and precautions for potential  emergency department visits.

## 2024-02-29 NOTE — Assessment & Plan Note (Signed)
 Not currently using CPAP despite previous sleep study indicating sleep apnea. Weight loss may have improved symptoms. Order a new home sleep test to reassess sleep apnea status and discuss potential for CPAP therapy based on new sleep study results.

## 2024-02-29 NOTE — Assessment & Plan Note (Signed)
 Cirrhosis with concern for liver scarring. An MRI of the liver is planned to assess scarring extent, but insurance may require evidence of a liver mass for coverage. Order MRI of the liver with sedation. If MRI is not covered, consider ultrasound as an alternative.

## 2024-02-29 NOTE — Assessment & Plan Note (Signed)
 Although this resolved she request MRI rule out liver cancer due to prior scare.

## 2024-03-05 ENCOUNTER — Telehealth: Payer: Self-pay

## 2024-03-05 ENCOUNTER — Ambulatory Visit: Payer: Self-pay | Admitting: Internal Medicine

## 2024-03-05 ENCOUNTER — Other Ambulatory Visit (HOSPITAL_COMMUNITY): Payer: Self-pay

## 2024-03-05 NOTE — Telephone Encounter (Signed)
 There is a manufacturer coupon that will bring it down to $499 for a 28 day supply when primary insurance doesn't cover it. I understand that is still expensive but unfortunately that is the cheapest she is going to find it. There is the compounded route but we don't recommend that. Based on a google search, the compounded version is about $199 for a 28 day supply. If she decides to go the manufacturer coupon path, she needs to visit this website: https://www.wegovy .com/coverage-and-savings/save-on-wegovy .html

## 2024-03-05 NOTE — Telephone Encounter (Signed)
 Pharmacy Patient Advocate Encounter   Received notification from Onbase that prior authorization for Wegovy  0.25 mg/0.5 ml pen is required/requested.   Insurance verification completed.   The patient is insured through [employer] .   Per test claim: product service not covered. Plan exclusion.

## 2024-03-15 ENCOUNTER — Ambulatory Visit (HOSPITAL_COMMUNITY): Admission: RE | Admit: 2024-03-15 | Source: Ambulatory Visit

## 2024-03-25 ENCOUNTER — Ambulatory Visit (HOSPITAL_COMMUNITY): Admission: RE | Admit: 2024-03-25 | Source: Ambulatory Visit

## 2024-04-02 ENCOUNTER — Ambulatory Visit: Admitting: Internal Medicine

## 2024-04-03 ENCOUNTER — Telehealth: Payer: Self-pay | Admitting: Internal Medicine

## 2024-04-03 NOTE — Telephone Encounter (Signed)
 Mailed no show letter due to missed appointment on 04/02/24.

## 2024-04-04 ENCOUNTER — Telehealth: Payer: Self-pay | Admitting: Internal Medicine

## 2024-04-04 NOTE — Telephone Encounter (Signed)
 Mailed no show letter due to missed appointment on 04/02/24.

## 2024-05-01 DIAGNOSIS — R300 Dysuria: Secondary | ICD-10-CM | POA: Diagnosis not present

## 2024-05-01 DIAGNOSIS — N949 Unspecified condition associated with female genital organs and menstrual cycle: Secondary | ICD-10-CM | POA: Diagnosis not present

## 2024-06-04 ENCOUNTER — Ambulatory Visit (INDEPENDENT_AMBULATORY_CARE_PROVIDER_SITE_OTHER): Admitting: Neurosurgery

## 2024-06-04 ENCOUNTER — Telehealth: Payer: Self-pay | Admitting: Neurosurgery

## 2024-06-04 ENCOUNTER — Encounter: Payer: Self-pay | Admitting: Neurosurgery

## 2024-06-04 VITALS — BP 137/81 | HR 77 | Ht 65.0 in | Wt 222.0 lb

## 2024-06-04 DIAGNOSIS — Z8249 Family history of ischemic heart disease and other diseases of the circulatory system: Secondary | ICD-10-CM

## 2024-06-04 DIAGNOSIS — R519 Headache, unspecified: Secondary | ICD-10-CM | POA: Diagnosis not present

## 2024-06-04 NOTE — Telephone Encounter (Signed)
 Patient does not want to do Valium. In the past she has had nursing sedation for her scans. Unfortuetly at [facility] and the hospitals this is not an option. She wants to proceed with scheduling an open MRI.

## 2024-06-04 NOTE — Telephone Encounter (Signed)
 I messaged MR Scheduling at [facility] to see if there is another sedation option that I am not aware of.

## 2024-06-04 NOTE — Telephone Encounter (Signed)
 [facility] does not do nurse sedation for MRIs.

## 2024-06-04 NOTE — Progress Notes (Signed)
 Assessment : Discussed the use of AI scribe software for clinical note transcription with the patient, who gave verbal consent to proceed.  History of Present Illness Patricia Gross is a 58 year old female who presents for evaluation of potential aneurysm.  She reports a family history of aneurysms affecting her mother, aunt, and cousins. Her daughter currently has a 4 mm aneurysm. Seventeen years ago, she underwent an MRI with contrast in [place] , which showed no aneurysm at that time. She is unsure if an aneurysm may have developed since her last MRI seventeen years ago.  She experiences headaches a few times a month, located on the top of her head. She typically does not take any medication for these headaches unless they become severe, at which point she takes ibuprofen .  Her past medical history includes fatty liver and asthma, which is not always active. She denies any history of smoking or kidney problems. She has had her gallbladder removed in the past.    Plan : It is totally understandable that she would want a MRA with this strong family history of for aneurysms.  To that end, I will order an MRA and I will see her back thereafter.  She would prefer an in person visit.   Social History   Socioeconomic History   Marital status: Divorced    Spouse name: Not on file   Number of children: 4   Years of education: Not on file   Highest education level: GED or equivalent  Occupational History   Occupation: cuts vegetables  Tobacco Use   Smoking status: Never   Smokeless tobacco: Never  Vaping Use   Vaping status: Never Used  Substance and Sexual Activity   Alcohol use: No   Drug use: No   Sexual activity: Yes  Other Topics Concern   Not on file  Social History Narrative   Originally from [place].   Came to [place]  in [place].   Social Drivers of Corporate Investment Banker Strain: High Risk (12/18/2023)   Overall Financial Resource Strain (CARDIA)    Difficulty of  Paying Living Expenses: Very hard  Food Insecurity: Food Insecurity Present (12/18/2023)   Hunger Vital Sign    Worried About Running Out of Food in the Last Year: Often true    Ran Out of Food in the Last Year: Often true  Transportation Needs: No Transportation Needs (12/18/2023)   PRAPARE - Administrator, Civil Service (Medical): No    Lack of Transportation (Non-Medical): No  Physical Activity: Unknown (12/18/2023)   Exercise Vital Sign    Days of Exercise per Week: 0 days    Minutes of Exercise per Session: Not on file  Stress: No Stress Concern Present (12/18/2023)   [employer] of Occupational Health - Occupational Stress Questionnaire    Feeling of Stress : Only a little  Social Connections: Socially Isolated (12/18/2023)   Social Connection and Isolation Panel    Frequency of Communication with Friends and Family: More than three times a week    Frequency of Social Gatherings with Friends and Family: Three times a week    Attends Religious Services: Never    Active Member of Clubs or Organizations: No    Attends Banker Meetings: Not on file    Marital Status: Divorced  Intimate Partner Violence: Unknown (01/05/2022)   Received from [facility]   HITS    Physically Hurt: Not on file    Insult or Talk  Down To: Not on file    Threaten Physical Harm: Not on file    Scream or Curse: Not on file    Family History  Problem Relation Age of Onset   Aneurysm Mother    Alcohol abuse Father     Allergies  Allergen Reactions   Lisinopril Shortness Of Breath    Past Medical History:  Diagnosis Date   Acute lumbar myofascial strain 08/02/2017   Arthralgia of multiple joints 01/25/2016   Arthritis    Asthma    Blood in urine 07/29/2017   Chronic pain    Hematuria 07/29/2017   Hypertension    Insulin resistance    Left lower quadrant pain 07/29/2017   Sleep apnea    Steatosis of liver 12/09/2023   Vitamin D deficiency     Past Surgical  History:  Procedure Laterality Date   CHOLECYSTECTOMY     GALLBLADDER SURGERY       Physical Exam   Physical Exam HENT:     Head: Normocephalic.     Nose: Nose normal.  Eyes:     Pupils: Pupils are equal, round, and reactive to light.  Cardiovascular:     Rate and Rhythm: Normal rate.  Pulmonary:     Effort: Pulmonary effort is normal.  Abdominal:     General: Abdomen is flat.  Musculoskeletal:     Cervical back: Normal range of motion.  Neurological:     Mental Status: She is alert.     Cranial Nerves: Cranial nerves 2-12 are intact.     Sensory: Sensation is intact.     Motor: Motor function is intact.     Coordination: Coordination is intact.     Results for orders placed or performed during the hospital encounter of 07/19/20  CT Head Wo Contrast   Narrative   CLINICAL DATA:  Pain behind eyes.  EXAM: CT HEAD WITHOUT CONTRAST  CT MAXILLOFACIAL WITHOUT CONTRAST  TECHNIQUE: Multidetector CT imaging of the head and maxillofacial structures were performed using the standard protocol without intravenous contrast. Multiplanar CT image reconstructions of the maxillofacial structures were also generated.  COMPARISON:  The patient's CT head from 2013 could not be retrieved for comparison.  FINDINGS: CT HEAD FINDINGS  Brain: No evidence of acute infarction, hemorrhage, hydrocephalus, extra-axial collection or mass lesion/mass effect.  Vascular: No hyperdense vessel or unexpected calcification.  Skull: Normal. Negative for fracture or focal lesion.  Other: None.  CT MAXILLOFACIAL FINDINGS  Osseous: No fracture or mandibular dislocation. No destructive process.  Orbits: Negative. No traumatic or inflammatory finding.  Sinuses: Clear.  Soft tissues: Negative. A dental carie and periapical lucency is noted involving one of the molars of the right mandible.  IMPRESSION: 1. No acute intracranial abnormality. 2. No acute facial fracture.  The sinuses are  essentially clear.   Electronically Signed   By: Lonni Seip M.D.   On: 07/19/2020 22:20

## 2024-06-04 NOTE — Telephone Encounter (Signed)
 Patient would like for her MRA to be done with sedation.

## 2024-06-04 NOTE — Telephone Encounter (Signed)
 Order was faxed to [facility]. They have an OPEN MRI.

## 2024-06-18 NOTE — Telephone Encounter (Signed)
 I called patient and notified her about this insurance issue. She is going to call [employer] tomorrow to see if she can locate something in network.  She will call and let me or Rosaline know

## 2024-06-21 ENCOUNTER — Ambulatory Visit: Admitting: Internal Medicine

## 2024-06-25 ENCOUNTER — Ambulatory Visit: Admitting: Neurosurgery

## 2024-07-23 ENCOUNTER — Ambulatory Visit: Admitting: Internal Medicine

## 2024-07-29 ENCOUNTER — Ambulatory Visit: Admitting: Internal Medicine

## 2024-07-29 ENCOUNTER — Encounter: Payer: Self-pay | Admitting: Internal Medicine

## 2024-07-29 VITALS — BP 122/78 | HR 78 | Temp 97.3°F | Wt 232.0 lb

## 2024-07-29 DIAGNOSIS — K7460 Unspecified cirrhosis of liver: Secondary | ICD-10-CM

## 2024-07-29 DIAGNOSIS — Z23 Encounter for immunization: Secondary | ICD-10-CM | POA: Diagnosis not present

## 2024-07-29 DIAGNOSIS — L2082 Flexural eczema: Secondary | ICD-10-CM

## 2024-07-29 DIAGNOSIS — E7849 Other hyperlipidemia: Secondary | ICD-10-CM | POA: Diagnosis not present

## 2024-07-29 DIAGNOSIS — I10 Essential (primary) hypertension: Secondary | ICD-10-CM | POA: Diagnosis not present

## 2024-07-29 DIAGNOSIS — R7303 Prediabetes: Secondary | ICD-10-CM | POA: Diagnosis not present

## 2024-07-29 DIAGNOSIS — K7469 Other cirrhosis of liver: Secondary | ICD-10-CM

## 2024-07-29 DIAGNOSIS — G4733 Obstructive sleep apnea (adult) (pediatric): Secondary | ICD-10-CM

## 2024-07-29 LAB — LIPID PANEL
Cholesterol: 181 mg/dL (ref 28–200)
HDL: 32 mg/dL — ABNORMAL LOW
NonHDL: 149.1
Total CHOL/HDL Ratio: 6
Triglycerides: 423 mg/dL — ABNORMAL HIGH (ref 10.0–149.0)
VLDL: 84.6 mg/dL — ABNORMAL HIGH (ref 0.0–40.0)

## 2024-07-29 LAB — CBC WITH DIFFERENTIAL/PLATELET
Basophils Absolute: 0 K/uL (ref 0.0–0.1)
Basophils Relative: 0.6 % (ref 0.0–3.0)
Eosinophils Absolute: 0.2 K/uL (ref 0.0–0.7)
Eosinophils Relative: 4.1 % (ref 0.0–5.0)
HCT: 39.2 % (ref 36.0–46.0)
Hemoglobin: 13.4 g/dL (ref 12.0–15.0)
Lymphocytes Relative: 34.3 % (ref 12.0–46.0)
Lymphs Abs: 2 K/uL (ref 0.7–4.0)
MCHC: 34.2 g/dL (ref 30.0–36.0)
MCV: 84.3 fl (ref 78.0–100.0)
Monocytes Absolute: 0.5 K/uL (ref 0.1–1.0)
Monocytes Relative: 7.9 % (ref 3.0–12.0)
Neutro Abs: 3.1 K/uL (ref 1.4–7.7)
Neutrophils Relative %: 53.1 % (ref 43.0–77.0)
Platelets: 195 K/uL (ref 150.0–400.0)
RBC: 4.65 Mil/uL (ref 3.87–5.11)
RDW: 14 % (ref 11.5–15.5)
WBC: 5.8 K/uL (ref 4.0–10.5)

## 2024-07-29 LAB — COMPREHENSIVE METABOLIC PANEL WITH GFR
ALT: 25 U/L (ref 3–35)
AST: 19 U/L (ref 5–37)
Albumin: 4.3 g/dL (ref 3.5–5.2)
Alkaline Phosphatase: 58 U/L (ref 39–117)
BUN: 16 mg/dL (ref 6–23)
CO2: 27 meq/L (ref 19–32)
Calcium: 9.2 mg/dL (ref 8.4–10.5)
Chloride: 105 meq/L (ref 96–112)
Creatinine, Ser: 0.59 mg/dL (ref 0.40–1.20)
GFR: 97.69 mL/min
Glucose, Bld: 96 mg/dL (ref 70–99)
Potassium: 3.8 meq/L (ref 3.5–5.1)
Sodium: 141 meq/L (ref 135–145)
Total Bilirubin: 0.4 mg/dL (ref 0.2–1.2)
Total Protein: 7 g/dL (ref 6.0–8.3)

## 2024-07-29 LAB — LDL CHOLESTEROL, DIRECT: Direct LDL: 86 mg/dL

## 2024-07-29 LAB — HEMOGLOBIN A1C: Hgb A1c MFr Bld: 5.5 % (ref 4.6–6.5)

## 2024-07-29 MED ORDER — BISOPROLOL FUMARATE 10 MG PO TABS: 10.0000 mg | ORAL_TABLET | Freq: Every day | ORAL | 4 refills | Status: AC

## 2024-07-29 MED ORDER — MOMETASONE FUROATE 0.1 % EX CREA: TOPICAL_CREAM | CUTANEOUS | 1 refills | Status: AC

## 2024-07-29 MED ORDER — TIRZEPATIDE-WEIGHT MANAGEMENT 2.5 MG/0.5ML ~~LOC~~ SOAJ: 2.5000 mg | SUBCUTANEOUS | 11 refills | Status: AC

## 2024-07-29 NOTE — Assessment & Plan Note (Signed)
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 A liver ultrasound with elastography is ordered to assess liver damage and support insurance coverage for medication. She is referred to a GI specialist for further evaluation and confirmation of diagnosis. Efforts are being made to obtain insurance coverage for tirzepatide  (Wegovy ) to address cirrhosis, obesity, and sleep apnea. The potential out-of-pocket cost of tirzepatide  is discussed if insurance does not cover it. "

## 2024-07-29 NOTE — Assessment & Plan Note (Signed)
Refilled cream.

## 2024-07-29 NOTE — Progress Notes (Signed)
 ==============================  [facility]: [phone]   -- Medical Office Visit --  Patient: Patricia Gross      Age: 58 y.o.       Sex:  female  Date:   07/29/2024 Today's Healthcare Provider: Bernardino KANDICE Cone, MD  ==============================   Chief Complaint: Medical Management of Chronic Issues (Here for Medical Management. Hypertension, fatty liver, Blood sugar issues. Unable to lose weight, wondering about a weight clinic or surgery)  Discussed the use of AI scribe software for clinical note transcription with the patient, who gave verbal consent to proceed.  History of Present Illness 58 year old female with cirrhosis of the liver and sleep apnea who presents for follow-up on her liver condition and weight management.  She is currently asymptomatic regarding her liver condition. Her last blood work in July showed elevated triglycerides. She had a liver ultrasound in May. She has not yet seen a liver specialist.  She has experienced weight fluctuations, with her weight being 237 pounds in May, decreasing to 218 or 219 pounds, and now approximately 232 pounds. She is frustrated with her weight management efforts. Her insurance has not approved certain weight loss medications. She is considering bariatric surgery as a last resort if medication is not an option.  She has sleep apnea but does not use a CPAP machine as she has never been provided with one. She reports having had a sleep study and was told she has sleep apnea.  She has a history of prediabetes and is trying to manage her sugar intake by avoiding sweets. She is concerned about her blood pressure, noting that it increases when she does not take her medication, which she has been taking regularly now.  Lab Results  Component Value Date   CHOL 181 07/29/2024   CHOL 188 12/06/2023   CHOL 176 06/16/2017   HDL 32.00 (L) 07/29/2024   HDL 36.30 (L) 12/06/2023   HDL 34 (L) 06/16/2017    LDLCALC 101 (H) 12/06/2023   LDLCALC 84 06/16/2017   TRIG (H) 07/29/2024    423.0 Triglyceride is over 400; calculations on Lipids are invalid.   TRIG 251.0 (H) 12/06/2023   TRIG 289 (H) 06/16/2017   CHOLHDL 6 07/29/2024   CHOLHDL 5 12/06/2023   CHOLHDL 5.2 (H) 06/16/2017   Wt Readings from Last 50 Encounters:  07/29/24 232 lb (105.2 kg)  06/04/24 222 lb (100.7 kg)  02/29/24 218 lb 9.6 oz (99.2 kg)  01/29/24 222 lb 12.8 oz (101.1 kg)  12/18/23 233 lb (105.7 kg)  12/06/23 237 lb 6.4 oz (107.7 kg)  10/04/22 230 lb (104.3 kg)  01/01/20 230 lb 6.4 oz (104.5 kg)  05/31/19 228 lb 12.8 oz (103.8 kg)  05/23/19 228 lb (103.4 kg)  05/11/19 230 lb (104.3 kg)  08/02/17 225 lb 12.8 oz (102.4 kg)  07/31/17 225 lb (102.1 kg)  07/29/17 223 lb 2 oz (101.2 kg)  06/16/17 222 lb (100.7 kg)  07/15/16 210 lb 3.2 oz (95.3 kg)  02/20/15 220 lb (99.8 kg)  03/04/14 222 lb (100.7 kg)   BMI Readings from Last 50 Encounters:  07/29/24 38.61 kg/m  06/04/24 36.94 kg/m  02/29/24 37.52 kg/m  01/29/24 38.24 kg/m  12/18/23 39.99 kg/m  12/06/23 40.75 kg/m  10/04/22 39.48 kg/m  01/01/20 39.55 kg/m  05/31/19 39.27 kg/m  05/23/19 39.14 kg/m  05/11/19 38.27 kg/m  08/02/17 36.45 kg/m  07/31/17 36.32 kg/m  07/29/17 36.01 kg/m  06/16/17 35.83 kg/m  07/15/16 34.98  kg/m  02/20/15 37.76 kg/m  03/04/14 38.11 kg/m      Background Reviewed: Problem List: has Fatty liver; Benign essential hypertension; Lumbar pain; Musculoskeletal pain; DOE (dyspnea on exertion); Morbid obesity due to excess calories (HCC) complicated by HBP/LVH; Generalized lymphadenopathy; Abdominal swelling; Paresthesia of upper and lower extremities of both sides; Flexural eczema; Heel pain, bilateral; Subcutaneous nodule; Hashimoto thyroiditis; Dyslipidemia; Diverticula of colon; Prediabetes; Eosinophilia; Arthritis; OSA (obstructive sleep apnea); Elevated liver enzymes; Heel pain; Hyperglycemia; Mild intermittent  asthma without complication; Obesity with body mass index 30 or greater; Reactive airway disease; Seasonal allergies; Varicose veins of lower extremity; Splenomegaly; and Hepatic cirrhosis (HCC) on their problem list. Past Medical History:  has a past medical history of Acute lumbar myofascial strain (08/02/2017), Arthralgia of multiple joints (01/25/2016), Arthritis, Asthma, Blood in urine (07/29/2017), Chronic pain, Hematuria (07/29/2017), Hypertension, Insulin resistance, Left lower quadrant pain (07/29/2017), Sleep apnea, Steatosis of liver (12/09/2023), and Vitamin D deficiency. Past Surgical History:   has a past surgical history that includes Gallbladder surgery and Cholecystectomy. Social History:   reports that she has never smoked. She has never used smokeless tobacco. She reports that she does not drink alcohol and does not use drugs. Family History:  family history includes Alcohol abuse in her father; Aneurysm in her mother. Allergies:  is allergic to lisinopril.   Medication Reconciliation: Current Outpatient Medications on File Prior to Visit  Medication Sig   bisoprolol  (ZEBETA ) 5 MG tablet Take 1 tablet (5 mg total) by mouth daily.   No current facility-administered medications on file prior to visit.   Medications Discontinued During This Encounter  Medication Reason   Semaglutide -Weight Management 0.25 MG/0.5ML SOAJ Prescription never filled   Semaglutide -Weight Management 0.5 MG/0.5ML SOAJ Prescription never filled   Semaglutide -Weight Management 1 MG/0.5ML SOAJ Prescription never filled   rosuvastatin  (CRESTOR ) 20 MG tablet Patient Preference   olmesartan  (BENICAR ) 20 MG tablet Patient Preference   mometasone  (ELOCON ) 0.1 % cream Reorder     Physical Exam:    07/29/2024    1:06 PM 06/04/2024   12:40 PM 02/29/2024    8:53 AM  Vitals with BMI  Height  5' 5 5' 4  Weight 232 lbs 222 lbs 218 lbs 10 oz  BMI  36.94 37.5  Systolic 122 137 869  Diastolic 78 81 74   Pulse 78 77 73  Vital signs reviewed.  Nursing notes reviewed. Weight trend reviewed. Physical Activity: Unknown (12/18/2023)   Exercise Vital Sign    Days of Exercise per Week: 0 days    Minutes of Exercise per Session: Not on file   General Appearance:  No acute distress appreciable.   Well-groomed, healthy-appearing female.  Well proportioned with no abnormal fat distribution.  Good muscle tone. Pulmonary:  Normal work of breathing at rest, no respiratory distress apparent. SpO2: 95 %  Musculoskeletal: All extremities are intact.  Neurological:  Awake, alert, oriented, and engaged.  No obvious focal neurological deficits or cognitive impairments.  Sensorium seems unclouded.   Speech is clear and coherent with logical content. Psychiatric:  Appropriate mood, pleasant and cooperative demeanor, thoughtful and engaged during the exam   Verbalized to patient: Physical Exam MEASUREMENTS: Weight- 232.   Results:   Verbalized to patient: Results Labs CMP (01/2024): Within normal limits Triglycerides (01/2024): Mildly elevated Cholesterol (01/2024): Elevated Hepatitis panel (11/2023): Negative Liver panel (11/2023): Findings consistent with fatty liver disease  Radiology Liver ultrasound (11/2023): Findings consistent with cirrhosis; hepatic parenchymal damage  Diagnostic Sleep study: Obstructive sleep  apnea diagnosed     12/06/2023    9:12 AM 08/02/2017    5:27 PM 07/29/2017    9:59 AM 06/16/2017    9:34 AM  PHQ 2/9 Scores  PHQ - 2 Score 0 0 0 0  PHQ- 9 Score 0         Data saved with a previous flowsheet row definition    Office Visit on 07/29/2024  Component Date Value Ref Range Status   Cholesterol 07/29/2024 181  28 - 200 mg/dL Final   Triglycerides 87/70/7974 423.0 Triglyceride is over 400; calculations on Lipids are invalid. (H)  10.0 - 149.0 mg/dL Final   HDL 87/70/7974 32.00 (L)  >60.99 mg/dL Final   VLDL 87/70/7974 84.6 (H)  0.0 - 40.0 mg/dL Final   Total  CHOL/HDL Ratio 07/29/2024 6   Final   NonHDL 07/29/2024 149.10   Final   Sodium 07/29/2024 141  135 - 145 mEq/L Final   Potassium 07/29/2024 3.8  3.5 - 5.1 mEq/L Final   Chloride 07/29/2024 105  96 - 112 mEq/L Final   CO2 07/29/2024 27  19 - 32 mEq/L Final   Glucose, Bld 07/29/2024 96  70 - 99 mg/dL Final   BUN 87/70/7974 16  6 - 23 mg/dL Final   Creatinine, Ser 07/29/2024 0.59  0.40 - 1.20 mg/dL Final   Total Bilirubin 07/29/2024 0.4  0.2 - 1.2 mg/dL Final   Alkaline Phosphatase 07/29/2024 58  39 - 117 U/L Final   AST 07/29/2024 19  5 - 37 U/L Final   ALT 07/29/2024 25  3 - 35 U/L Final   Total Protein 07/29/2024 7.0  6.0 - 8.3 g/dL Final   Albumin 87/70/7974 4.3  3.5 - 5.2 g/dL Final   GFR 87/70/7974 97.69  >60.00 mL/min Final   Calcium  07/29/2024 9.2  8.4 - 10.5 mg/dL Final   WBC 87/70/7974 5.8  4.0 - 10.5 K/uL Final   RBC 07/29/2024 4.65  3.87 - 5.11 Mil/uL Final   Hemoglobin 07/29/2024 13.4  12.0 - 15.0 g/dL Final   HCT 87/70/7974 39.2  36.0 - 46.0 % Final   MCV 07/29/2024 84.3  78.0 - 100.0 fl Final   MCHC 07/29/2024 34.2  30.0 - 36.0 g/dL Final   RDW 87/70/7974 14.0  11.5 - 15.5 % Final   Platelets 07/29/2024 195.0  150.0 - 400.0 K/uL Final   Neutrophils Relative % 07/29/2024 53.1  43.0 - 77.0 % Final   Lymphocytes Relative 07/29/2024 34.3  12.0 - 46.0 % Final   Monocytes Relative 07/29/2024 7.9  3.0 - 12.0 % Final   Eosinophils Relative 07/29/2024 4.1  0.0 - 5.0 % Final   Basophils Relative 07/29/2024 0.6  0.0 - 3.0 % Final   Neutro Abs 07/29/2024 3.1  1.4 - 7.7 K/uL Final   Lymphs Abs 07/29/2024 2.0  0.7 - 4.0 K/uL Final   Monocytes Absolute 07/29/2024 0.5  0.1 - 1.0 K/uL Final   Eosinophils Absolute 07/29/2024 0.2  0.0 - 0.7 K/uL Final   Basophils Absolute 07/29/2024 0.0  0.0 - 0.1 K/uL Final   Hgb A1c MFr Bld 07/29/2024 5.5  4.6 - 6.5 % Final   Direct LDL 07/29/2024 86.0  mg/dL Final  Office Visit on 02/29/2024  Component Date Value Ref Range Status   WBC 02/29/2024  5.8  4.0 - 10.5 K/uL Final   RBC 02/29/2024 4.95  3.87 - 5.11 Mil/uL Final   Hemoglobin 02/29/2024 13.8  12.0 - 15.0 g/dL Final   HCT  02/29/2024 42.3  36.0 - 46.0 % Final   MCV 02/29/2024 85.5  78.0 - 100.0 fl Final   MCHC 02/29/2024 32.8  30.0 - 36.0 g/dL Final   RDW 92/68/7974 14.0  11.5 - 15.5 % Final   Platelets 02/29/2024 178.0  150.0 - 400.0 K/uL Final   Neutrophils Relative % 02/29/2024 53.9  43.0 - 77.0 % Final   Lymphocytes Relative 02/29/2024 35.0  12.0 - 46.0 % Final   Monocytes Relative 02/29/2024 6.7  3.0 - 12.0 % Final   Eosinophils Relative 02/29/2024 3.8  0.0 - 5.0 % Final   Basophils Relative 02/29/2024 0.6  0.0 - 3.0 % Final   Neutro Abs 02/29/2024 3.1  1.4 - 7.7 K/uL Final   Lymphs Abs 02/29/2024 2.0  0.7 - 4.0 K/uL Final   Monocytes Absolute 02/29/2024 0.4  0.1 - 1.0 K/uL Final   Eosinophils Absolute 02/29/2024 0.2  0.0 - 0.7 K/uL Final   Basophils Absolute 02/29/2024 0.0  0.0 - 0.1 K/uL Final   Sodium 02/29/2024 140  135 - 145 mEq/L Final   Potassium 02/29/2024 4.2  3.5 - 5.1 mEq/L Final   Chloride 02/29/2024 106  96 - 112 mEq/L Final   CO2 02/29/2024 26  19 - 32 mEq/L Final   Glucose, Bld 02/29/2024 106 (H)  70 - 99 mg/dL Final   BUN 92/68/7974 15  6 - 23 mg/dL Final   Creatinine, Ser 02/29/2024 0.70  0.40 - 1.20 mg/dL Final   Total Bilirubin 02/29/2024 0.4  0.2 - 1.2 mg/dL Final   Alkaline Phosphatase 02/29/2024 59  39 - 117 U/L Final   AST 02/29/2024 23  0 - 37 U/L Final   ALT 02/29/2024 30  0 - 35 U/L Final   Total Protein 02/29/2024 7.4  6.0 - 8.3 g/dL Final   Albumin 92/68/7974 4.5  3.5 - 5.2 g/dL Final   GFR 92/68/7974 94.02  >60.00 mL/min Final   Calcium  02/29/2024 9.5  8.4 - 10.5 mg/dL Final  Office Visit on 12/06/2023  Component Date Value Ref Range Status   Cholesterol 12/06/2023 188  0 - 200 mg/dL Final   Triglycerides 94/92/7974 251.0 (H)  0.0 - 149.0 mg/dL Final   HDL 94/92/7974 36.30 (L)  >60.99 mg/dL Final   VLDL 94/92/7974 50.2 (H)  0.0 -  40.0 mg/dL Final   LDL Cholesterol 12/06/2023 101 (H)  0 - 99 mg/dL Final   Total CHOL/HDL Ratio 12/06/2023 5   Final   NonHDL 12/06/2023 151.64   Final   Sodium 12/06/2023 139  135 - 145 mEq/L Final   Potassium 12/06/2023 4.3  3.5 - 5.1 mEq/L Final   Chloride 12/06/2023 104  96 - 112 mEq/L Final   CO2 12/06/2023 26  19 - 32 mEq/L Final   Glucose, Bld 12/06/2023 108 (H)  70 - 99 mg/dL Final   BUN 94/92/7974 13  6 - 23 mg/dL Final   Creatinine, Ser 12/06/2023 0.61  0.40 - 1.20 mg/dL Final   Total Bilirubin 12/06/2023 0.6  0.2 - 1.2 mg/dL Final   Alkaline Phosphatase 12/06/2023 68  39 - 117 U/L Final   AST 12/06/2023 30  0 - 37 U/L Final   ALT 12/06/2023 37 (H)  0 - 35 U/L Final   Total Protein 12/06/2023 7.2  6.0 - 8.3 g/dL Final   Albumin 94/92/7974 4.6  3.5 - 5.2 g/dL Final   GFR 94/92/7974 97.35  >60.00 mL/min Final   Calcium  12/06/2023 9.8  8.4 - 10.5 mg/dL  Final   TSH 12/06/2023 3.670  0.450 - 4.500 uIU/mL Final   Hgb A1c MFr Bld 12/06/2023 5.8  4.6 - 6.5 % Final   Sed Rate 12/06/2023 19  0 - 30 mm/hr Final   CRP 12/06/2023 <1.0  0.5 - 20.0 mg/dL Final   Anti Nuclear Antibody (ANA) 12/06/2023 Negative  Negative Final   AST 12/06/2023 34  0 - 40 IU/L Final   ALT 12/06/2023 41 (H)  0 - 32 IU/L Final   Platelets 12/06/2023 215  150 - 450 x10E3/uL Final   FIB-4 Index 12/06/2023 1.48  0.00 - 2.67 Final   GGT 12/06/2023 49  7 - 51 U/L Final   Hep A IgM 12/06/2023 NON-REACTIVE  NON-REACTIVE Final   Hepatitis B Surface Ag 12/06/2023 NON-REACTIVE  NON-REACTIVE Final   Hep B C IgM 12/06/2023 NON-REACTIVE  NON-REACTIVE Final   HEPATITIS C ANTIBODY REFILL$(REFL) 12/06/2023 NON-REACTIVE  NON-REACTIVE Final   A-1 Antitrypsin, Ser 12/06/2023 122  83 - 199 mg/dL Final   Ferritin 94/92/7974 152.5  10.0 - 291.0 ng/mL Final   LKM1 Ab 12/06/2023 <=20.0  <=20.0 U Final   Smooth Muscle Ab 12/06/2023 12  0 - 19 Units Final   Mitochondrial Ab 12/06/2023 <20.0  0.0 - 20.0 Units Final   Amylase  12/06/2023 35  27 - 131 U/L Final   Lipase 12/06/2023 35.0  11.0 - 59.0 U/L Final   Color, Urine 12/06/2023 YELLOW  YELLOW Final   APPearance 12/06/2023 CLEAR  CLEAR Final   Specific Gravity, Urine 12/06/2023 1.013  1.001 - 1.035 Final   pH 12/06/2023 5.5  5.0 - 8.0 Final   Glucose, UA 12/06/2023 NEGATIVE  NEGATIVE Final   Bilirubin Urine 12/06/2023 NEGATIVE  NEGATIVE Final   Ketones, ur 12/06/2023 NEGATIVE  NEGATIVE Final   Hgb urine dipstick 12/06/2023 NEGATIVE  NEGATIVE Final   Protein, ur 12/06/2023 NEGATIVE  NEGATIVE Final   Nitrite 12/06/2023 NEGATIVE  NEGATIVE Final   Leukocytes,Ua 12/06/2023 NEGATIVE  NEGATIVE Final   WBC, UA 12/06/2023 NONE SEEN  0 - 5 /HPF Final   RBC / HPF 12/06/2023 NONE SEEN  0 - 2 /HPF Final   Squamous Epithelial / HPF 12/06/2023 0-5  < OR = 5 /HPF Final   Bacteria, UA 12/06/2023 NONE SEEN  NONE SEEN /HPF Final   Hyaline Cast 12/06/2023 NONE SEEN  NONE SEEN /LPF Final   Note 12/06/2023    Final   WBC 12/06/2023 5.8  4.0 - 10.5 K/uL Final   RBC 12/06/2023 5.12 (H)  3.87 - 5.11 Mil/uL Final   Hemoglobin 12/06/2023 14.5  12.0 - 15.0 g/dL Final   HCT 94/92/7974 44.3  36.0 - 46.0 % Final   MCV 12/06/2023 86.5  78.0 - 100.0 fl Final   MCHC 12/06/2023 32.8  30.0 - 36.0 g/dL Final   RDW 94/92/7974 14.2  11.5 - 15.5 % Final   Platelets 12/06/2023 198.0  150.0 - 400.0 K/uL Final   Neutrophils Relative % 12/06/2023 51.1  43.0 - 77.0 % Final   Lymphocytes Relative 12/06/2023 34.8  12.0 - 46.0 % Final   Monocytes Relative 12/06/2023 5.9  3.0 - 12.0 % Final   Eosinophils Relative 12/06/2023 7.5 (H)  0.0 - 5.0 % Final   Basophils Relative 12/06/2023 0.7  0.0 - 3.0 % Final   Neutro Abs 12/06/2023 2.9  1.4 - 7.7 K/uL Final   Lymphs Abs 12/06/2023 2.0  0.7 - 4.0 K/uL Final   Monocytes Absolute 12/06/2023 0.3  0.1 -  1.0 K/uL Final   Eosinophils Absolute 12/06/2023 0.4  0.0 - 0.7 K/uL Final   Basophils Absolute 12/06/2023 0.0  0.0 - 0.1 K/uL Final   REFLEX TIQ  12/06/2023    Final   ELF(TM) Score 12/06/2023 CANCELED   Final-Edited  Admission on 10/04/2022, Discharged on 10/04/2022  Component Date Value Ref Range Status   Lipase 10/04/2022 43  11 - 51 U/L Final   Sodium 10/04/2022 140  135 - 145 mmol/L Final   Potassium 10/04/2022 3.9  3.5 - 5.1 mmol/L Final   Chloride 10/04/2022 107  98 - 111 mmol/L Final   CO2 10/04/2022 19 (L)  22 - 32 mmol/L Final   Glucose, Bld 10/04/2022 110 (H)  70 - 99 mg/dL Final   BUN 96/94/7975 12  6 - 20 mg/dL Final   Creatinine, Ser 10/04/2022 0.70  0.44 - 1.00 mg/dL Final   Calcium  10/04/2022 9.1  8.9 - 10.3 mg/dL Final   Total Protein 96/94/7975 7.0  6.5 - 8.1 g/dL Final   Albumin 96/94/7975 4.0  3.5 - 5.0 g/dL Final   AST 96/94/7975 31  15 - 41 U/L Final   ALT 10/04/2022 37  0 - 44 U/L Final   Alkaline Phosphatase 10/04/2022 56  38 - 126 U/L Final   Total Bilirubin 10/04/2022 0.6  0.3 - 1.2 mg/dL Final   GFR, Estimated 10/04/2022 >60  >60 mL/min Final   Anion gap 10/04/2022 14  5 - 15 Final   Color, Urine 10/04/2022 STRAW (A)  YELLOW Final   APPearance 10/04/2022 CLEAR  CLEAR Final   Specific Gravity, Urine 10/04/2022 1.010  1.005 - 1.030 Final   pH 10/04/2022 6.0  5.0 - 8.0 Final   Glucose, UA 10/04/2022 NEGATIVE  NEGATIVE mg/dL Final   Hgb urine dipstick 10/04/2022 NEGATIVE  NEGATIVE Final   Bilirubin Urine 10/04/2022 NEGATIVE  NEGATIVE Final   Ketones, ur 10/04/2022 NEGATIVE  NEGATIVE mg/dL Final   Protein, ur 96/94/7975 NEGATIVE  NEGATIVE mg/dL Final   Nitrite 96/94/7975 NEGATIVE  NEGATIVE Final   Leukocytes,Ua 10/04/2022 NEGATIVE  NEGATIVE Final   WBC 10/04/2022 6.1  4.0 - 10.5 K/uL Final   RBC 10/04/2022 4.90  3.87 - 5.11 MIL/uL Final   Hemoglobin 10/04/2022 13.7  12.0 - 15.0 g/dL Final   HCT 96/94/7975 42.6  36.0 - 46.0 % Final   MCV 10/04/2022 86.9  80.0 - 100.0 fL Final   MCH 10/04/2022 28.0  26.0 - 34.0 pg Final   MCHC 10/04/2022 32.2  30.0 - 36.0 g/dL Final   RDW 96/94/7975 13.3  11.5 - 15.5  % Final   Platelets 10/04/2022 213  150 - 400 K/uL Final   nRBC 10/04/2022 0.0  0.0 - 0.2 % Final   Neutrophils Relative % 10/04/2022 51  % Final   Neutro Abs 10/04/2022 3.1  1.7 - 7.7 K/uL Final   Lymphocytes Relative 10/04/2022 36  % Final   Lymphs Abs 10/04/2022 2.2  0.7 - 4.0 K/uL Final   Monocytes Relative 10/04/2022 6  % Final   Monocytes Absolute 10/04/2022 0.3  0.1 - 1.0 K/uL Final   Eosinophils Relative 10/04/2022 6  % Final   Eosinophils Absolute 10/04/2022 0.4  0.0 - 0.5 K/uL Final   Basophils Relative 10/04/2022 1  % Final   Basophils Absolute 10/04/2022 0.0  0.0 - 0.1 K/uL Final   Immature Granulocytes 10/04/2022 0  % Final   Abs Immature Granulocytes 10/04/2022 0.01  0.00 - 0.07 K/uL Final   Preg  Test, Ur 10/04/2022 NEGATIVE  NEGATIVE Final  No image results found. No results found.       ASSESSMENT & PLAN   Assessment & Plan Cirrhosis of liver without ascites, unspecified hepatic cirrhosis type (HCC) Other cirrhosis of liver (HCC)  cirrhosis is confirmed by previous ultrasound and is likely somewhat reversible with appropriate treatment as it appears to be Nonalcoholic Fatty Liver Disease (NAFLD) , though insurance coverage for medication is a barrier. A liver ultrasound with elastography is ordered to assess liver damage and support insurance coverage for medication. She is referred to a GI specialist for further evaluation and confirmation of diagnosis. Efforts are being made to obtain insurance coverage for tirzepatide  (Wegovy ) to address cirrhosis, obesity, and sleep apnea. The potential out-of-pocket cost of tirzepatide  is discussed if insurance does not cover it. Flexural eczema Refilled cream Immunization due After discussion of medical recommendations/risks/benefits, vaccine was given  OSA (obstructive sleep apnea) Obstructive sleep apnea is contributing to overall health issues. CPAP therapy has not been initiated due to lack of equipment and insurance  coverage. A CPAP machine is ordered, and she is instructed to pursue insurance coverage for it. Other hyperlipidemia Shared decision-making done; patient understood rationale and agreed to [facility]  Morbid obesity due to excess calories (HCC) complicated by HBP/LVH Morbid obesity due to excess calories   Morbid obesity is contributing to cirrhosis and sleep apnea. Weight loss is crucial for improving overall health. Tirzepatide  is recommended for weight loss, fatty liver, and sleep apnea management. Bariatric surgery is considered a last resort if medication is not feasible. Efforts are being made to obtain insurance coverage for tirzepatide  (Wegovy ) for weight loss, and the potential out-of-pocket cost is discussed if insurance does not cover it. Bariatric surgery is considered as a last resort if medication is not feasible. Prediabetes There is potential for progression to diabetes due to liver dysfunction, making monitoring essential. A1c is checked to assess current glycemic control. Benign essential hypertension Primary hypertension Possibly from obstructive sleep apnea Blood pressure is well-controlled with the current medication regimen. Continue current antihypertensive medication regimen. She preferred I increase her blood pressure medication(s) because it runs high at home  ORDER ASSOCIATIONS  #   DIAGNOSIS / CONDITION ICD-10 ENCOUNTER ORDER     ICD-10-CM   1. Immunization due  Z23 Flu vaccine trivalent PF, 6mos and older(Flulaval,Afluria,Fluarix,Fluzone)    Pneumococcal conjugate vaccine 20-valent (Prevnar 20)    2. Flexural eczema  L20.82 mometasone  (ELOCON ) 0.1 % cream    3. Other cirrhosis of liver (HCC)  K74.69 Ambulatory referral to Gastroenterology    tirzepatide  (ZEPBOUND ) 2.5 MG/0.5ML Pen    Comprehensive metabolic panel with GFR    CBC with Differential/Platelet    US  ABDOMEN RUQ W/ELASTOGRAPHY    4. OSA (obstructive sleep apnea)  G47.33 tirzepatide  (ZEPBOUND ) 2.5  MG/0.5ML Pen    5. Other hyperlipidemia  E78.49 Lipid panel    6. Morbid obesity due to excess calories (HCC) complicated by HBP/LVH  E66.01 TSH Rfx on Abnormal to Free T4    For home use only DME continuous positive airway pressure (CPAP)    Amb Referral to Bariatric Surgery    7. Prediabetes  R73.03 HgB A1c    8. Benign essential hypertension  I10 bisoprolol  (ZEBETA ) 10 MG tablet    9. Primary hypertension  I10            Orders Placed in Encounter:   Lab Orders         Lipid panel  Comprehensive metabolic panel with GFR         CBC with Differential/Platelet         TSH Rfx on Abnormal to Free T4         HgB A1c         LDL cholesterol, direct     Imaging Orders         US  ABDOMEN RUQ W/ELASTOGRAPHY     Referral Orders         Ambulatory referral to Gastroenterology         Amb Referral to Bariatric Surgery     Meds ordered this encounter  Medications   mometasone  (ELOCON ) 0.1 % cream    Sig: Apply as needed to skin rash/eczema    Dispense:  15 g    Refill:  1   tirzepatide  (ZEPBOUND ) 2.5 MG/0.5ML Pen    Sig: Inject 2.5 mg into the skin once a week.    Dispense:  2 mL    Refill:  11   bisoprolol  (ZEBETA ) 10 MG tablet    Sig: Take 1 tablet (10 mg total) by mouth daily.    Dispense:  90 tablet    Refill:  4      Durable Medical Equipment  (From admission, onward)           Start     Ordered   07/29/24 0000  For home use only DME continuous positive airway pressure (CPAP)       Question Answer Comment  Length of Need Lifetime   Patient has OSA or probable OSA Yes   Is the patient currently using CPAP in the home No   Settings Autotitration   Signs and symptoms of probable OSA  (select all that apply) Snoring   Signs and symptoms of probable OSA  (select all that apply) Moring headaches   Signs and symptoms of probable OSA  (select all that apply) Witnessed apneas   Signs and symptoms of probable OSA  (select all that apply) Choking   CPAP  supplies needed Mask, headgear, cushions, filters, heated tubing and water chamber      07/29/24 1359                 This document was synthesized by artificial intelligence ([employer]) using HIPAA-compliant recording of the clinical interaction;   We discussed the use of AI scribe software for clinical note transcription with the patient, who gave verbal consent to proceed. additional Info: This encounter employed state-of-the-art, real-time, collaborative documentation. The patient actively reviewed and assisted in updating their electronic medical record on a shared screen, ensuring transparency and facilitating joint problem-solving for the problem list, overview, and plan. This approach promotes accurate, informed care. The treatment plan was discussed and reviewed in detail, including medication safety, potential side effects, and all patient questions. We confirmed understanding and comfort with the plan. Follow-up instructions were established, including contacting the office for any concerns, returning if symptoms worsen, persist, or new symptoms develop, and precautions for potential emergency department visits.

## 2024-07-29 NOTE — Assessment & Plan Note (Signed)
 Possibly from obstructive sleep apnea Blood pressure is well-controlled with the current medication regimen. Continue current antihypertensive medication regimen. She preferred I increase her blood pressure medication(s) because it runs high at home

## 2024-07-29 NOTE — Patient Instructions (Signed)
 " Department: [facility] PULMONARY CARE [address] [place] [id number]  It was a pleasure seeing you today! Your health and satisfaction are our top priorities.  Patricia Gross, Patricia Gross  VISIT SUMMARY: Today, we discussed your liver condition, weight management, sleep apnea, and other health concerns. We reviewed your recent blood work and ultrasound results, and we talked about your weight fluctuations and challenges with weight management. We also addressed your sleep apnea and the need for a CPAP machine, as well as your prediabetes and blood pressure management.  YOUR PLAN: -CIRRHOSIS OF LIVER: Cirrhosis is a condition where the liver is severely scarred and damaged. We have ordered a liver ultrasound with elastography to assess the extent of liver damage and to support insurance coverage for medication. You are also referred to a GI specialist for further evaluation. We are working on museum/gallery curator coverage for tirzepatide  (Wegovy ) to help manage your cirrhosis, obesity, and sleep apnea. If insurance does not cover it, we discussed the potential out-of-pocket costs.  -MORBID OBESITY DUE TO EXCESS CALORIES: Morbid obesity is a condition where excess body fat negatively affects your health. Weight loss is important for improving your overall health, including your liver condition and sleep apnea. We recommend tirzepatide  (Wegovy ) for weight loss, and we are working on getting insurance coverage for it. If medication is not an option, bariatric surgery may be considered as a last resort.  -OBSTRUCTIVE SLEEP APNEA: Obstructive sleep apnea is a condition where your breathing stops and starts during sleep. We have ordered a CPAP machine for you and instructed you to pursue insurance coverage for it.  -HYPERLIPIDEMIA: Hyperlipidemia is a condition where there are high levels of fats (lipids) in your blood. We will continue to monitor your lipid levels and manage them as  needed.  -PREDIABETES: Prediabetes is a condition where your blood sugar levels are higher than normal but not high enough to be classified as diabetes. We checked your A1c to assess your current blood sugar control and will continue to monitor it to prevent progression to diabetes.  -PRIMARY HYPERTENSION: Primary hypertension is high blood pressure with no identifiable cause. Your blood pressure is well-controlled with your current medication regimen, so please continue taking your medication as prescribed.  INSTRUCTIONS: Please follow up with the GI specialist as referred. Pursue insurance coverage for the CPAP machine and tirzepatide  (Wegovy ). Continue taking your blood pressure medication as prescribed. We will monitor your A1c and lipid levels regularly. If you have any questions or concerns, please do not hesitate to contact our office.  Your Providers PCP: Patricia Gross MATSU, Patricia Gross,  [phone]) Referring Provider: [name] MATSU, Patricia Gross,  [phone])  NEXT STEPS: [x]  Early Intervention: Schedule sooner appointment, call our on-call services, or go to emergency room if there is any significant Increase in pain or discomfort New or worsening symptoms Sudden or severe changes in your health [x]  Flexible Follow-Up: We recommend a No follow-ups on file. for optimal routine care. This allows for progress monitoring and treatment adjustments. [x]  Preventive Care: Schedule your annual preventive care visit! It's typically covered by insurance and helps identify potential health issues early. [x]  Lab & X-ray Appointments: Incomplete tests scheduled today, or call to schedule. X-rays: [facility] (M-F, 8:30am-noon or 1pm-5pm). [x]  Medical Information Release: Sign a release form at front desk to obtain relevant medical information we don't have.  MAKING THE MOST OF OUR FOCUSED 20 MINUTE APPOINTMENTS: [x]   Clearly state your top concerns at  the beginning of the visit to focus our  discussion [x]   If you anticipate you will need more time, please inform the front desk during scheduling - we can book multiple appointments in the same week. [x]   If you have transportation problems- use our convenient video appointments or ask about transportation support. [x]   We can get down to business faster if you use MyChart to update information before the visit and submit non-urgent questions before your visit. Thank you for taking the time to provide details through MyChart.  Let our nurse know and she can import this information into your encounter documents.  Arrival and Wait Times: [x]   Arriving on time ensures that everyone receives prompt attention. [x]   Early morning (8a) and afternoon (1p) appointments tend to have shortest wait times. [x]   Unfortunately, we cannot delay appointments for late arrivals or hold slots during phone calls.  Getting Answers and Following Up [x]   Simple Questions & Concerns: For quick questions or basic follow-up after your visit, reach us  at [phone] or MyChart messaging. [x]   Complex Concerns: If your concern is more complex, scheduling an appointment might be best. Discuss this with the staff to find the most suitable option. [x]   Lab & Imaging Results: We'll contact you directly if results are abnormal or you don't use MyChart. Most normal results will be on MyChart within 2-3 business days, with a review message from Dr. Jesus. Haven't heard back in 2 weeks? Need results sooner? Contact us  at [phone]. [x]   Referrals: Our referral coordinator will manage specialist referrals. The specialist's office should contact you within 2 weeks to schedule an appointment. Call us  if you haven't heard from them after 2 weeks.  Staying Connected [x]   MyChart: Activate your MyChart for the fastest way to access results and message us . See the last page of this paperwork for instructions on how to activate.  Bring to Your Next Appointment [x]    Medications: Please bring all your medication bottles to your next appointment to ensure we have an accurate record of your prescriptions. [x]   Health Diaries: If you're monitoring any health conditions at home, keeping a diary of your readings can be very helpful for discussions at your next appointment.  Billing [x]   X-ray & Lab Orders: These are billed by separate companies. Contact the invoicing company directly for questions or concerns. [x]   Visit Charges: Discuss any billing inquiries with our administrative services team.  Your Satisfaction Matters [x]   Share Your Experience: We strive for your satisfaction! If you have any complaints, or preferably compliments, please let Dr. Jesus know directly or contact our Practice Administrators, Patricia Gross or [employer], by asking at the front desk.   Reviewing Your Records [x]   Review this early draft of your clinical encounter notes below and the final encounter summary tomorrow on MyChart after its been completed.  All orders placed so far are visible here: Immunization due -     Flu vaccine trivalent PF, 6mos and older(Flulaval,Afluria,Fluarix,Fluzone) -     Pneumococcal conjugate vaccine 20-valent  Flexural eczema -     Mometasone  Furoate; Apply as needed to skin rash/eczema  Dispense: 15 g; Refill: 1  Other cirrhosis of liver (HCC) -     Ambulatory referral to Gastroenterology -     Tirzepatide -Weight Management; Inject 2.5 mg into the skin once a week.  Dispense: 2 mL; Refill: 11 -     Comprehensive metabolic panel with GFR -     CBC  with Differential/Platelet -     US  ABDOMEN RUQ W/ELASTOGRAPHY; Future  OSA (obstructive sleep apnea) -     Tirzepatide -Weight Management; Inject 2.5 mg into the skin once a week.  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

## 2024-07-29 NOTE — Assessment & Plan Note (Signed)
 There is potential for progression to diabetes due to liver dysfunction, making monitoring essential. A1c is checked to assess current glycemic control.

## 2024-07-29 NOTE — Assessment & Plan Note (Signed)
 Obstructive sleep apnea is contributing to overall health issues. CPAP therapy has not been initiated due to lack of equipment and insurance coverage. A CPAP machine is ordered, and she is instructed to pursue insurance coverage for it.

## 2024-07-29 NOTE — Assessment & Plan Note (Signed)
 Morbid obesity due to excess calories   Morbid obesity is contributing to cirrhosis and sleep apnea. Weight loss is crucial for improving overall health. Tirzepatide  is recommended for weight loss, fatty liver, and sleep apnea management. Bariatric surgery is considered a last resort if medication is not feasible. Efforts are being made to obtain insurance coverage for tirzepatide  (Wegovy ) for weight loss, and the potential out-of-pocket cost is discussed if insurance does not cover it. Bariatric surgery is considered as a last resort if medication is not feasible.

## 2024-07-30 LAB — TSH RFX ON ABNORMAL TO FREE T4: TSH: 2.8 u[IU]/mL (ref 0.450–4.500)

## 2024-08-01 ENCOUNTER — Ambulatory Visit: Payer: Self-pay | Admitting: Internal Medicine

## 2024-08-02 NOTE — Telephone Encounter (Signed)
 Spoke with pt via phone about labs understood well.

## 2024-08-29 ENCOUNTER — Ambulatory Visit: Admitting: Internal Medicine

## 2024-09-10 ENCOUNTER — Ambulatory Visit: Admitting: Internal Medicine
